# Patient Record
Sex: Male | Born: 1985 | Race: White | Hispanic: Yes | Marital: Single | Smoking: Never smoker
Health system: Southern US, Community
[De-identification: ages and names within clinical notes are randomized; demographics above are authoritative.]

---
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
## 2007-02-01 IMAGING — CR DG FOREARM 2V*L*
1 series · 2 of 2 positions shown · non-contrast
Comparison: none

REASON FOR EXAM: pain
COMMENTS:

PROCEDURE:     DXR - DXR FOREARM LEFT  - October 30, 2005  [DATE]
RESULT:     Two views of the LEFT forearm show no fracture, dislocation or
other acute bony abnormality.

[Series 1: view not recorded · 0.17mm/px · 2 of 2 slices shown]
[im 1/2]
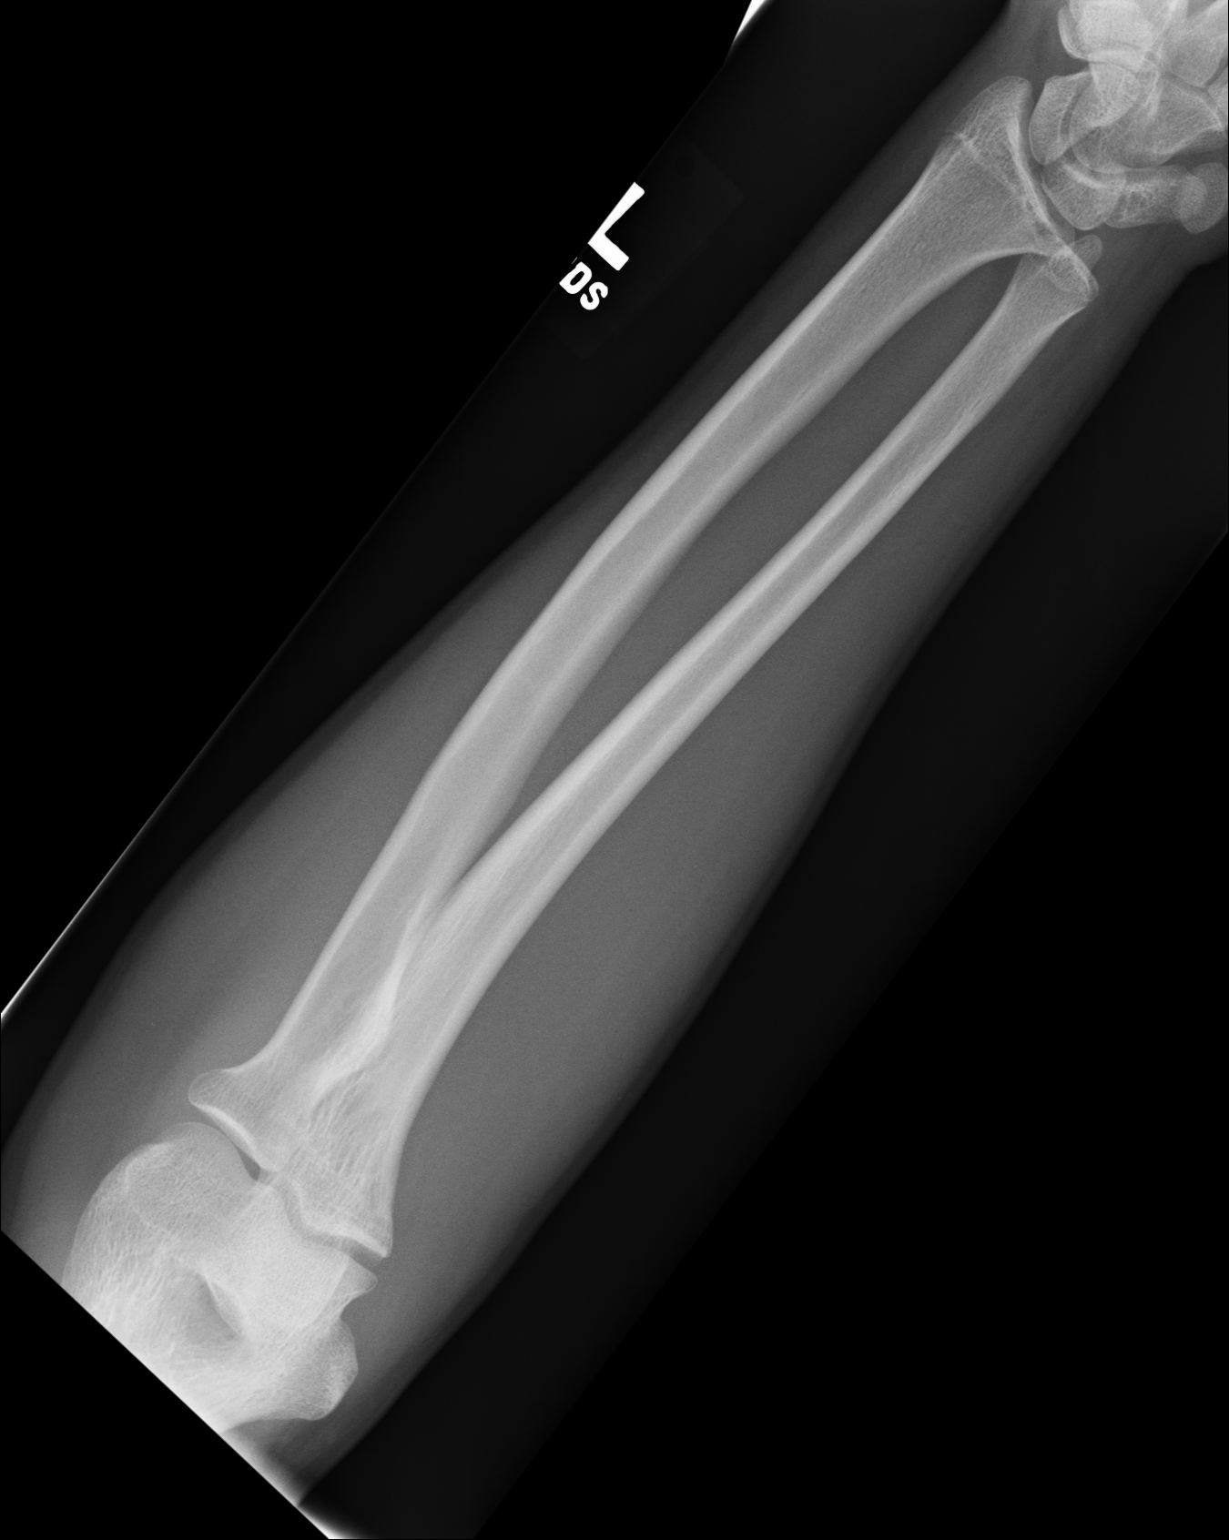
[im 2/2]
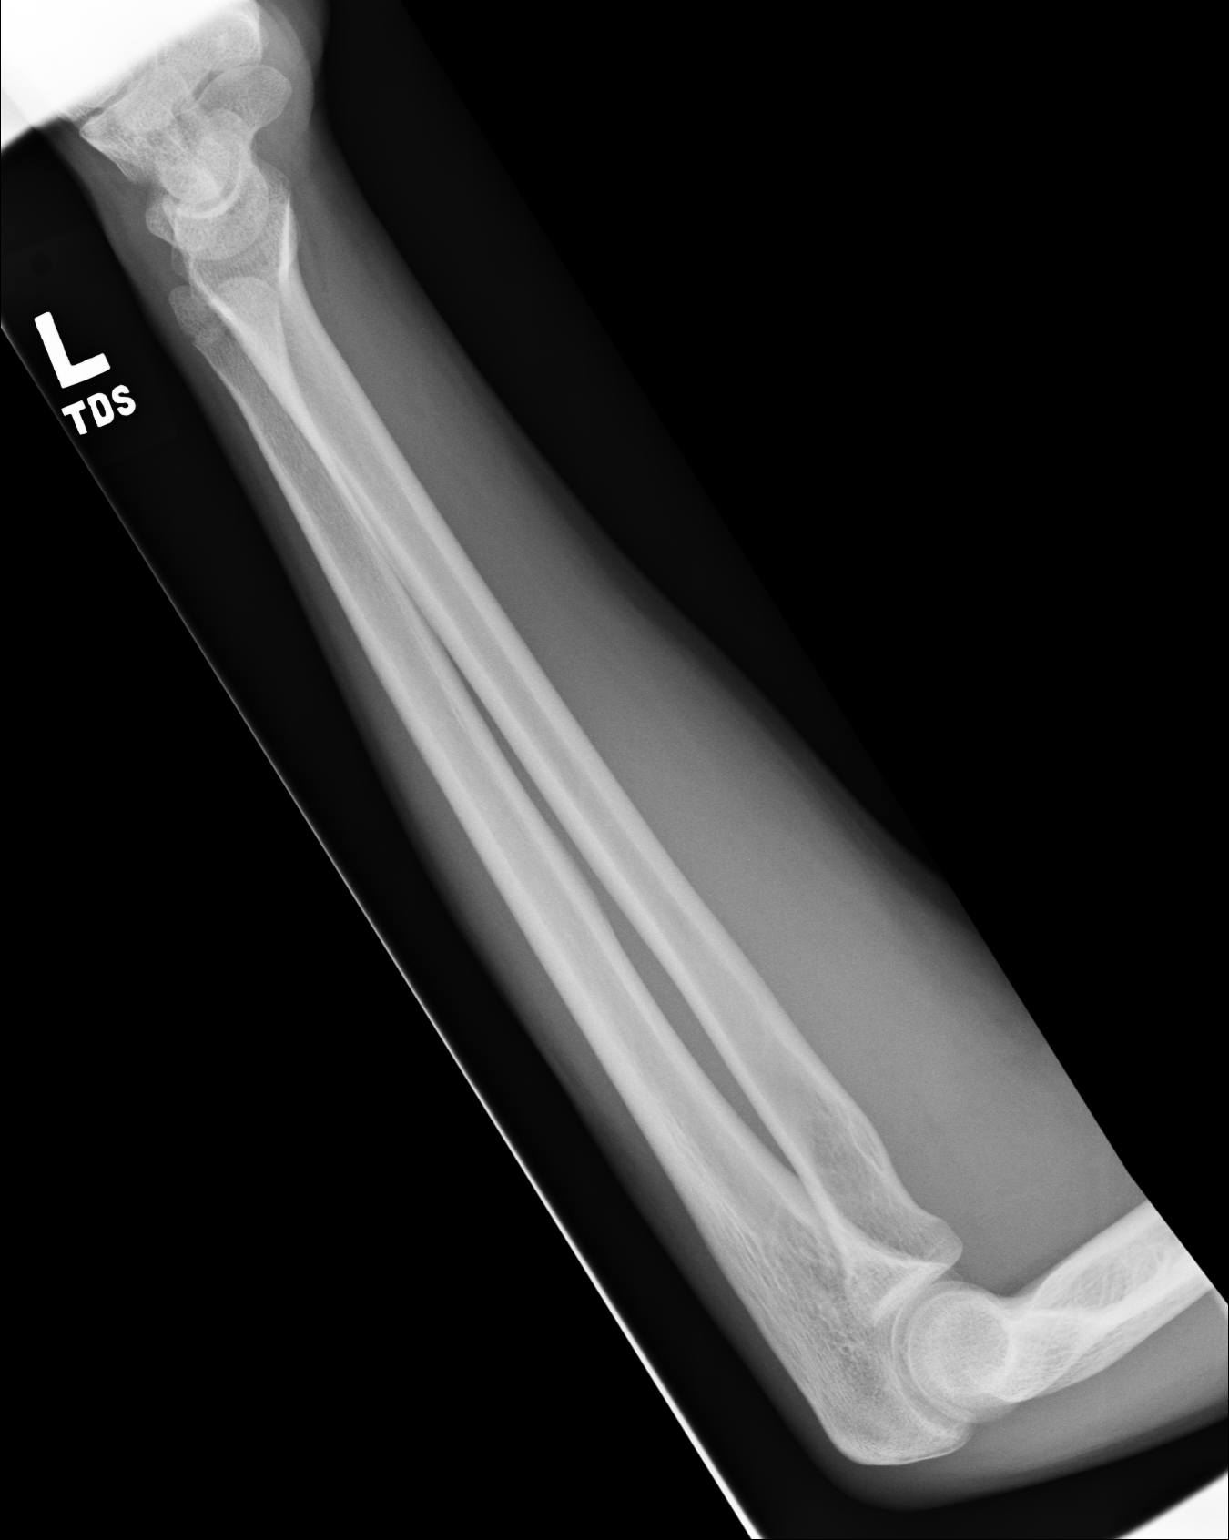

[2 of 2 positions shown; findings below may reference images not displayed]

IMPRESSION: 1)No acute changes are identified.

## 2015-12-27 ENCOUNTER — Encounter: Payer: Self-pay | Admitting: Podiatry

## 2015-12-27 ENCOUNTER — Ambulatory Visit (INDEPENDENT_AMBULATORY_CARE_PROVIDER_SITE_OTHER): Payer: 59

## 2015-12-27 ENCOUNTER — Ambulatory Visit (INDEPENDENT_AMBULATORY_CARE_PROVIDER_SITE_OTHER): Payer: 59 | Admitting: Podiatry

## 2015-12-27 VITALS — BP 125/81 | HR 74 | Resp 18

## 2015-12-27 DIAGNOSIS — B351 Tinea unguium: Secondary | ICD-10-CM | POA: Diagnosis not present

## 2015-12-27 DIAGNOSIS — R52 Pain, unspecified: Secondary | ICD-10-CM | POA: Diagnosis not present

## 2015-12-27 DIAGNOSIS — M722 Plantar fascial fibromatosis: Secondary | ICD-10-CM

## 2015-12-27 DIAGNOSIS — B353 Tinea pedis: Secondary | ICD-10-CM | POA: Insufficient documentation

## 2015-12-27 MED ORDER — MELOXICAM 15 MG PO TABS: 15.0000 mg | ORAL_TABLET | Freq: Every day | ORAL | Status: DC

## 2015-12-27 MED ORDER — TERBINAFINE HCL 250 MG PO TABS: 250.0000 mg | ORAL_TABLET | Freq: Every day | ORAL | Status: DC

## 2015-12-27 NOTE — Patient Instructions (Addendum)
Terbinafine oral granules What is this medicine? TERBINAFINE (TER bin a feen) is an antifungal medicine. It is used to treat certain kinds of fungal or yeast infections. This medicine may be used for other purposes; ask your health care provider or pharmacist if you have questions. What should I tell my health care provider before I take this medicine? They need to know if you have any of these conditions: -drink alcoholic beverages -kidney disease -liver disease -an unusual or allergic reaction to Terbinafine, other medicines, foods, dyes, or preservatives -pregnant or trying to get pregnant -breast-feeding How should I use this medicine? Take this medicine by mouth. Follow the directions on the prescription label. Hold packet with cut line on top. Shake packet gently to settle contents. Tear packet open along cut line, or use scissors to cut across line. Carefully pour the entire contents of packet onto a spoonful of a soft food, such as pudding or other soft, non-acidic food such as mashed potatoes (do NOT use applesauce or a fruit-based food). If two packets are required for each dose, you may either sprinkle the content of both packets on one spoonful of non-acidic food, or sprinkle the contents of both packets on two spoonfuls of non-acidic food. Make sure that no granules remain in the packet. Swallow the mxiture of the food and granules without chewing. Take your medicine at regular intervals. Do not take it more often than directed. Take all of your medicine as directed even if you think you are better. Do not skip doses or stop your medicine early. Contact your pediatrician or health care professional regarding the use of this medicine in children. While this medicine may be prescribed for children as young as 4 years for selected conditions, precautions do apply. Overdosage: If you think you have taken too much of this medicine contact a poison control center or emergency room at  once. NOTE: This medicine is only for you. Do not share this medicine with others. What if I miss a dose? If you miss a dose, take it as soon as you can. If it is almost time for your next dose, take only that dose. Do not take double or extra doses. What may interact with this medicine? Do not take this medicine with any of the following medications: -thioridazine This medicine may also interact with the following medications: -beta-blockers -caffeine -cimetidine -cyclosporine -MAOIs like Carbex, Eldepryl, Marplan, Nardil, and Parnate -medicines for fungal infections like fluconazole and ketoconazole -medicines for irregular heartbeat like amiodarone, flecainide and propafenone -rifampin -SSRIs like citalopram, escitalopram, fluoxetine, fluvoxamine, paroxetine and sertraline -tricyclic antidepressants like amitriptyline, clomipramine, desipramine, imipramine, nortriptyline, and others -warfarin This list may not describe all possible interactions. Give your health care provider a list of all the medicines, herbs, non-prescription drugs, or dietary supplements you use. Also tell them if you smoke, drink alcohol, or use illegal drugs. Some items may interact with your medicine. What should I watch for while using this medicine? Your doctor may monitor your liver function. Tell your doctor right away if you have nausea or vomiting, loss of appetite, stomach pain on your right upper side, yellow skin, dark urine, light stools, or are over tired. You need to take this medicine for 6 weeks or longer to cure the fungal infection. Take your medicine regularly for as long as your doctor or health care professional tells you to. What side effects may I notice from receiving this medicine? Side effects that you should report to your doctor or   health care professional as soon as possible: -allergic reactions like skin rash or hives, swelling of the face, lips, or tongue -change in vision -dark  urine -fever or infection -general ill feeling or flu-like symptoms -light-colored stools -loss of appetite, nausea -redness, blistering, peeling or loosening of the skin, including inside the mouth -right upper belly pain -unusually weak or tired -yellowing of the eyes or skin Side effects that usually do not require medical attention (report to your doctor or health care professional if they continue or are bothersome): -changes in taste -diarrhea -hair loss -muscle or joint pain -stomach upset This list may not describe all possible side effects. Call your doctor for medical advice about side effects. You may report side effects to FDA at 1-800-FDA-1088. Where should I keep my medicine? Keep out of the reach of children. Store at room temperature between 15 and 30 degrees C (59 and 86 degrees F). Throw away any unused medicine after the expiration date. NOTE: This sheet is a summary. It may not cover all possible information. If you have questions about this medicine, talk to your doctor, pharmacist, or health care provider.    2016, Elsevier/Gold Standard. (2007-11-19 17:25:48)   Plantar Fasciitis With Rehab The plantar fascia is a fibrous, ligament-like, soft-tissue structure that spans the bottom of the foot. Plantar fasciitis, also called heel spur syndrome, is a condition that causes pain in the foot due to inflammation of the tissue. SYMPTOMS   Pain and tenderness on the underneath side of the foot.  Pain that worsens with standing or walking. CAUSES  Plantar fasciitis is caused by irritation and injury to the plantar fascia on the underneath side of the foot. Common mechanisms of injury include:  Direct trauma to bottom of the foot.  Damage to a small nerve that runs under the foot where the main fascia attaches to the heel bone.  Stress placed on the plantar fascia due to bone spurs. RISK INCREASES WITH:   Activities that place stress on the plantar fascia  (running, jumping, pivoting, or cutting).  Poor strength and flexibility.  Improperly fitted shoes.  Tight calf muscles.  Flat feet.  Failure to warm-up properly before activity.  Obesity. PREVENTION  Warm up and stretch properly before activity.  Allow for adequate recovery between workouts.  Maintain physical fitness:  Strength, flexibility, and endurance.  Cardiovascular fitness.  Maintain a health body weight.  Avoid stress on the plantar fascia.  Wear properly fitted shoes, including arch supports for individuals who have flat feet. PROGNOSIS  If treated properly, then the symptoms of plantar fasciitis usually resolve without surgery. However, occasionally surgery is necessary. RELATED COMPLICATIONS   Recurrent symptoms that may result in a chronic condition.  Problems of the lower back that are caused by compensating for the injury, such as limping.  Pain or weakness of the foot during push-off following surgery.  Chronic inflammation, scarring, and partial or complete fascia tear, occurring more often from repeated injections. TREATMENT  Treatment initially involves the use of ice and medication to help reduce pain and inflammation. The use of strengthening and stretching exercises may help reduce pain with activity, especially stretches of the Achilles tendon. These exercises may be performed at home or with a therapist. Your caregiver may recommend that you use heel cups of arch supports to help reduce stress on the plantar fascia. Occasionally, corticosteroid injections are given to reduce inflammation. If symptoms persist for greater than 6 months despite non-surgical (conservative), then surgery may be  recommended.  MEDICATION   If pain medication is necessary, then nonsteroidal anti-inflammatory medications, such as aspirin and ibuprofen, or other minor pain relievers, such as acetaminophen, are often recommended.  Do not take pain medication within 7 days  before surgery.  Prescription pain relievers may be given if deemed necessary by your caregiver. Use only as directed and only as much as you need.  Corticosteroid injections may be given by your caregiver. These injections should be reserved for the most serious cases, because they may only be given a certain number of times. HEAT AND COLD  Cold treatment (icing) relieves pain and reduces inflammation. Cold treatment should be applied for 10 to 15 minutes every 2 to 3 hours for inflammation and pain and immediately after any activity that aggravates your symptoms. Use ice packs or massage the area with a piece of ice (ice massage).  Heat treatment may be used prior to performing the stretching and strengthening activities prescribed by your caregiver, physical therapist, or athletic trainer. Use a heat pack or soak the injury in warm water. SEEK IMMEDIATE MEDICAL CARE IF:  Treatment seems to offer no benefit, or the condition worsens.  Any medications produce adverse side effects. EXERCISES RANGE OF MOTION (ROM) AND STRETCHING EXERCISES - Plantar Fasciitis (Heel Spur Syndrome) These exercises may help you when beginning to rehabilitate your injury. Your symptoms may resolve with or without further involvement from your physician, physical therapist or athletic trainer. While completing these exercises, remember:   Restoring tissue flexibility helps normal motion to return to the joints. This allows healthier, less painful movement and activity.  An effective stretch should be held for at least 30 seconds.  A stretch should never be painful. You should only feel a gentle lengthening or release in the stretched tissue. RANGE OF MOTION - Toe Extension, Flexion  Sit with your right / left leg crossed over your opposite knee.  Grasp your toes and gently pull them back toward the top of your foot. You should feel a stretch on the bottom of your toes and/or foot.  Hold this stretch for  __________ seconds.  Now, gently pull your toes toward the bottom of your foot. You should feel a stretch on the top of your toes and or foot.  Hold this stretch for __________ seconds. Repeat __________ times. Complete this stretch __________ times per day.  RANGE OF MOTION - Ankle Dorsiflexion, Active Assisted  Remove shoes and sit on a chair that is preferably not on a carpeted surface.  Place right / left foot under knee. Extend your opposite leg for support.  Keeping your heel down, slide your right / left foot back toward the chair until you feel a stretch at your ankle or calf. If you do not feel a stretch, slide your bottom forward to the edge of the chair, while still keeping your heel down.  Hold this stretch for __________ seconds. Repeat __________ times. Complete this stretch __________ times per day.  STRETCH - Gastroc, Standing  Place hands on wall.  Extend right / left leg, keeping the front knee somewhat bent.  Slightly point your toes inward on your back foot.  Keeping your right / left heel on the floor and your knee straight, shift your weight toward the wall, not allowing your back to arch.  You should feel a gentle stretch in the right / left calf. Hold this position for __________ seconds. Repeat __________ times. Complete this stretch __________ times per day. STRETCH - Soleus,  Standing  Place hands on wall.  Extend right / left leg, keeping the other knee somewhat bent.  Slightly point your toes inward on your back foot.  Keep your right / left heel on the floor, bend your back knee, and slightly shift your weight over the back leg so that you feel a gentle stretch deep in your back calf.  Hold this position for __________ seconds. Repeat __________ times. Complete this stretch __________ times per day. STRETCH - Gastrocsoleus, Standing  Note: This exercise can place a lot of stress on your foot and ankle. Please complete this exercise only if  specifically instructed by your caregiver.   Place the ball of your right / left foot on a step, keeping your other foot firmly on the same step.  Hold on to the wall or a rail for balance.  Slowly lift your other foot, allowing your body weight to press your heel down over the edge of the step.  You should feel a stretch in your right / left calf.  Hold this position for __________ seconds.  Repeat this exercise with a slight bend in your right / left knee. Repeat __________ times. Complete this stretch __________ times per day.  STRENGTHENING EXERCISES - Plantar Fasciitis (Heel Spur Syndrome)  These exercises may help you when beginning to rehabilitate your injury. They may resolve your symptoms with or without further involvement from your physician, physical therapist or athletic trainer. While completing these exercises, remember:   Muscles can gain both the endurance and the strength needed for everyday activities through controlled exercises.  Complete these exercises as instructed by your physician, physical therapist or athletic trainer. Progress the resistance and repetitions only as guided. STRENGTH - Towel Curls  Sit in a chair positioned on a non-carpeted surface.  Place your foot on a towel, keeping your heel on the floor.  Pull the towel toward your heel by only curling your toes. Keep your heel on the floor.  If instructed by your physician, physical therapist or athletic trainer, add ____________________ at the end of the towel. Repeat __________ times. Complete this exercise __________ times per day. STRENGTH - Ankle Inversion  Secure one end of a rubber exercise band/tubing to a fixed object (table, pole). Loop the other end around your foot just before your toes.  Place your fists between your knees. This will focus your strengthening at your ankle.  Slowly, pull your big toe up and in, making sure the band/tubing is positioned to resist the entire  motion.  Hold this position for __________ seconds.  Have your muscles resist the band/tubing as it slowly pulls your foot back to the starting position. Repeat __________ times. Complete this exercises __________ times per day.    This information is not intended to replace advice given to you by your health care provider. Make sure you discuss any questions you have with your health care provider.   Document Released: 09/08/2005 Document Revised: 01/23/2015 Document Reviewed: 12/21/2008 Elsevier Interactive Patient Education 2016 Elsevier Inc.  Athlete's Foot Athlete's foot (tinea pedis) is a fungal infection of the skin on the feet. It often occurs on the skin between the toes or underneath the toes. It can also occur on the soles of the feet. Athlete's foot is more likely to occur in hot, humid weather. Not washing your feet or changing your socks often enough can contribute to athlete's foot. The infection can spread from person to person (contagious). CAUSES Athlete's foot is caused by a  fungus. This fungus thrives in warm, moist places. Most people get athlete's foot by sharing shower stalls, towels, and wet floors with an infected person. People with weakened immune systems, including those with diabetes, may be more likely to get athlete's foot. SYMPTOMS   Itchy areas between the toes or on the soles of the feet.  White, flaky, or scaly areas between the toes or on the soles of the feet.  Tiny, intensely itchy blisters between the toes or on the soles of the feet.  Tiny cuts on the skin. These cuts can develop a bacterial infection.  Thick or discolored toenails. DIAGNOSIS  Your caregiver can usually tell what the problem is by doing a physical exam. Your caregiver may also take a skin sample from the rash area. The skin sample may be examined under a microscope, or it may be tested to see if fungus will grow in the sample. A sample may also be taken from your toenail for  testing. TREATMENT  Over-the-counter and prescription medicines can be used to kill the fungus. These medicines are available as powders or creams. Your caregiver can suggest medicines for you. Fungal infections respond slowly to treatment. You may need to continue using your medicine for several weeks. PREVENTION   Do not share towels.  Wear sandals in wet areas, such as shared locker rooms and shared showers.  Keep your feet dry. Wear shoes that allow air to circulate. Wear cotton or wool socks. HOME CARE INSTRUCTIONS   Take medicines as directed by your caregiver. Do not use steroid creams on athlete's foot.  Keep your feet clean and cool. Wash your feet daily and dry them thoroughly, especially between your toes.  Change your socks every day. Wear cotton or wool socks. In hot climates, you may need to change your socks 2 to 3 times per day.  Wear sandals or canvas tennis shoes with good air circulation.  If you have blisters, soak your feet in Burow's solution or Epsom salts for 20 to 30 minutes, 2 times a day to dry out the blisters. Make sure you dry your feet thoroughly afterward. SEEK MEDICAL CARE IF:   You have a fever.  You have swelling, soreness, warmth, or redness in your foot.  You are not getting better after 7 days of treatment.  You are not completely cured after 30 days.  You have any problems caused by your medicines. MAKE SURE YOU:   Understand these instructions.  Will watch your condition.  Will get help right away if you are not doing well or get worse.   This information is not intended to replace advice given to you by your health care provider. Make sure you discuss any questions you have with your health care provider.   Document Released: 09/05/2000 Document Revised: 12/01/2011 Document Reviewed: 03/12/2015 Elsevier Interactive Patient Education [employer]2016 Else[employer] Inc.

## 2015-12-27 NOTE — Progress Notes (Signed)
   Subjective:    Patient ID: Colin Davis, male    DOB: 1986-01-26, 30 y.o.   MRN: [id number]  HPI  30 year old male presents the office today for concerns of bilateral heel pain which has been ongoing for pocket 6 months. He states he has purchased over-the-counter inserts which seems to help. He does state he continues to get pain to the bottom of the heels. Denies any surrounding redness or drainage. No swelling or redness. The majority the pain is in of the day after his been standing all day.  He also states that he has athlete's foot as well as toenail fungus which is been ongoing. Said no previous treatment. Denies any pain to the toenails. Denies any redness or drainage. No other complaints.   Review of Systems  All other systems reviewed and are negative.      Objective:   Physical Exam General: AAO x3, NAD  Dermatological: Nails are very hypertrophic, dystrophic, brittle, discolored. There is no swelling erythema or drainage. No tenderness the nails. There is dry, xerotic, erythematous skin on the plantar aspect of the foot as well as interdigitally consistent with significant tinea pedis. There is no open sores identified this time there is no drainage.  Vascular: Dorsalis Pedis artery and Posterior Tibial artery pedal pulses are 2/4 bilateral with immedate capillary fill time. Pedal hair growth present. No varicosities and no lower extremity edema present bilateral. There is no pain with calf compression, swelling, warmth, erythema.   Neruologic: Grossly intact via light touch bilateral. Vibratory intact via tuning fork bilateral. Protective threshold with Semmes Wienstein monofilament intact to all pedal sites bilateral. Patellar and Achilles deep tendon reflexes 2+ bilateral. No Babinski or clonus noted bilateral.   Musculoskeletal: Tenderness to palpation along the plantar medial tubercle of the calcaneus at the insertion of plantar fascia on the left and  right foot. There is no pain along the course of the plantar fascia within the arch of the foot. Plantar fascia appears to be intact. There is no pain with lateral compression of the calcaneus or pain with vibratory sensation. There is no pain along the course or insertion of the achilles tendon. No other areas of tenderness to bilateral lower extremities. MMT 5/5, ROM WNL.    Gait: Unassisted, Nonantalgic.      Assessment & Plan:  30 year old male bilateral heel pain, onychodystrophy likely onychomycosis/tinea pedis -Treatment options discussed including all alternatives, risks, and complications -X-rays were obtained and reviewed with the patient. No evidence of acute fracture or stress fracture. -Etiology of symptoms were discussed -Discussed steroid injection however given the significant tinea over on the side of were would inject we'll hold off on that for now. Prescribed meloxicam and discussed that affects. Stretching, icing exercises daily. Continue inserts for now. -In regards the tinea pedis will go insert 14 day course of Lamisil. The nails were biopsied and sent for cultures/pathology. Will likely continue Lamisil however will await the results of the biopsy. Discussed shoe hygiene. -Follow-up in 3 weeks or sooner if any problems arise. In the meantime, encouraged to call the office with any questions, concerns, change in symptoms.   Ovid CurdMatthew Wagoner, DPM

## 2015-12-28 NOTE — Addendum Note (Signed)
Addended by: Hadley PenOX, Baylin Cabal R on: 12/28/2015 11:41 AM   Modules accepted: Orders

## 2016-01-22 ENCOUNTER — Ambulatory Visit (INDEPENDENT_AMBULATORY_CARE_PROVIDER_SITE_OTHER): Payer: 59 | Admitting: Podiatry

## 2016-01-22 ENCOUNTER — Encounter: Payer: Self-pay | Admitting: Podiatry

## 2016-01-22 DIAGNOSIS — B353 Tinea pedis: Secondary | ICD-10-CM | POA: Diagnosis not present

## 2016-01-22 DIAGNOSIS — M722 Plantar fascial fibromatosis: Secondary | ICD-10-CM

## 2016-01-22 DIAGNOSIS — M79673 Pain in unspecified foot: Secondary | ICD-10-CM

## 2016-01-22 DIAGNOSIS — B351 Tinea unguium: Secondary | ICD-10-CM | POA: Diagnosis not present

## 2016-01-22 MED ORDER — TERBINAFINE HCL 250 MG PO TABS: 250.0000 mg | ORAL_TABLET | Freq: Every day | ORAL | Status: DC

## 2016-01-22 MED ORDER — MELOXICAM 15 MG PO TABS: 15.0000 mg | ORAL_TABLET | Freq: Every day | ORAL | Status: DC

## 2016-01-22 NOTE — Patient Instructions (Signed)

## 2016-01-25 LAB — CBC WITH DIFFERENTIAL/PLATELET
Basophils Absolute: 0 10*3/uL (ref 0.0–0.2)
Basos: 0 %
EOS (ABSOLUTE): 0 10*3/uL (ref 0.0–0.4)
EOS: 1 %
HEMATOCRIT: 43.9 % (ref 37.5–51.0)
HEMOGLOBIN: 14.7 g/dL (ref 12.6–17.7)
IMMATURE GRANULOCYTES: 0 %
Immature Grans (Abs): 0 10*3/uL (ref 0.0–0.1)
Lymphocytes Absolute: 2.4 10*3/uL (ref 0.7–3.1)
Lymphs: 34 %
MCH: 30.9 pg (ref 26.6–33.0)
MCHC: 33.5 g/dL (ref 31.5–35.7)
MCV: 92 fL (ref 79–97)
MONOCYTES: 4 %
Monocytes Absolute: 0.3 10*3/uL (ref 0.1–0.9)
NEUTROS PCT: 61 %
Neutrophils Absolute: 4.3 10*3/uL (ref 1.4–7.0)
Platelets: 225 10*3/uL (ref 150–379)
RBC: 4.75 x10E6/uL (ref 4.14–5.80)
RDW: 13 % (ref 12.3–15.4)
WBC: 7 10*3/uL (ref 3.4–10.8)

## 2016-01-25 LAB — HEPATIC FUNCTION PANEL
ALT: 23 IU/L (ref 0–44)
AST: 16 IU/L (ref 0–40)
Albumin: 4.9 g/dL (ref 3.5–5.5)
Alkaline Phosphatase: 70 IU/L (ref 39–117)
Bilirubin Total: 1.2 mg/dL (ref 0.0–1.2)
Bilirubin, Direct: 0.25 mg/dL (ref 0.00–0.40)
TOTAL PROTEIN: 7.4 g/dL (ref 6.0–8.5)

## 2016-01-26 DIAGNOSIS — B351 Tinea unguium: Secondary | ICD-10-CM | POA: Insufficient documentation

## 2016-01-26 NOTE — Progress Notes (Signed)
Patient ID: Colin SkeansJose Rogelio Hernandez-Villela, male   DOB: 08/06/1986, 30 y.o.   MRN: [id number]  Subjective: 30 year old male presents the also discussed nail culture results as well as for follow-up evaluation of bilateral heel pain. This for the toenails are concerned he's had no change. He has been on 14 days of Lamisil he states that the skin has improved however. As far as the heels are concerned he is continued to have pain of the plantar aspect of the heel and when he first gets up after standing all day. This has been ongoing for 6 months. He has been taking meloxicam which helps he is asking for refill. His been stretching icing intermittently. No numbness or tingling. The pain does not wake him up at night..  Denies any systemic complaints such as fevers, chills, nausea, vomiting. No acute changes since last appointment, and no other complaints at this time.   Objective: AAO x3, NAD DP/PT pulses palpable bilaterally, CRT less than 3 seconds Nails continue to be hypertrophic, dystrophic, brittle, discolored without any tenderness. There is no surrounding redness or drainage. The dry, xerotic, erythematous skin on the plantar the foot and interdigitally has improved however does remain somewhat.  There is continuation tenderness of plantar aspect of the medial tubercle of the calcaneus on the insertion upon her fascia bilaterally. Is no pain along the course of plantar fascial. There is no pain with meals lateral compression. No other areas of tenderness. Negative Tinel sign.  No areas of pinpoint bony tenderness or pain with vibratory sensation. MMT 5/5, ROM WNL. No edema, erythema, increase in warmth to bilateral lower extremities.  No open lesions or pre-ulcerative lesions.  No pain with calf compression, swelling, warmth, erythema  Assessment: Bilateral plantar fasciitis, onychomycosis/tinea pedis   Plan: -All treatment options discussed with the patient including all alternatives,  risks, complications.  -Patient elects to proceed with steroid injection into the left and right heel. Under sterile skin preparation, a total of 2.5cc of kenalog 10, 0.5% Marcaine plain, and 2% lidocaine plain were infiltrated into the symptomatic area without complication. A band-aid was applied. Patient tolerated the injection well without complication. Post-injection care with discussed with the patient. Discussed with the patient to ice the area over the next couple of days to help prevent a steroid flare. Refilled meloxicam. Stretching and icing. Plantar fascial braces. Discussed supportive shoe gear and orthotics.  -Nail culture results were discussed the patient which did reveal onychomycosis. After discussion about treatment options he elected to proceed with Lamisil. We'll continue Lamisil for 3 months. Ordered blood work today. Do not restart the medicines I called with results of the blood work and he understood this. As discussed side effects the medication for which she understands is to call the office in medial should any occur.  -Follow-up 4 weeks.  -Patient encouraged to call the office with any questions, concerns, change in symptoms.   Ovid CurdMatthew Oley Lahaie, DPM

## 2016-01-28 ENCOUNTER — Telehealth: Payer: Self-pay | Admitting: *Deleted

## 2016-01-28 NOTE — Telephone Encounter (Addendum)
-----   Message from Vivi BarrackMatthew R Wagoner, DPM sent at 01/26/2016  8:42 AM EDT ----- Can you please let him know he can start lamisil. F/U 4 weeks.  01/28/2016-Left message for pt to call for results.

## 2016-02-19 ENCOUNTER — Ambulatory Visit (INDEPENDENT_AMBULATORY_CARE_PROVIDER_SITE_OTHER): Payer: 59 | Admitting: Podiatry

## 2016-02-19 VITALS — BP 126/74 | HR 75 | Resp 18

## 2016-02-19 DIAGNOSIS — Z79899 Other long term (current) drug therapy: Secondary | ICD-10-CM

## 2016-02-19 DIAGNOSIS — M722 Plantar fascial fibromatosis: Secondary | ICD-10-CM

## 2016-02-19 DIAGNOSIS — B351 Tinea unguium: Secondary | ICD-10-CM

## 2016-02-19 MED ORDER — TERBINAFINE HCL 250 MG PO TABS: 250.0000 mg | ORAL_TABLET | Freq: Every day | ORAL | Status: DC

## 2016-02-19 NOTE — Progress Notes (Signed)
Patient ID: Colin SkeansJose Rogelio Hernandez-Villela, male   DOB: 1986/07/03, 30 y.o.   MRN: [id number]  Subjective: 30 year old male presents the office today 4 weeks ago started Lamisil. He states that his toenails have cleared up somewhat he states the skin is looking much better. He also states he is not getting any pain to his heels this time but he does get some intermittent discomfort but is significantly improved to what it was. He has continue the stretching, icing. Denies any systemic complaints such as fevers, chills, nausea, vomiting. No acute changes since last appointment, and no other complaints at this time.   Objective: AAO x3, NAD DP/PT pulses palpable bilaterally, CRT less than 3 seconds This time there is no tenderness to palpation on the plantar medial tubercle of the calcaneus at the insertion of the plantar fascia. There is no pain on the course of plantar fascial in the arch of the foot. No area pinpoint tenderness. There does appear to be clearing the proximal nail borders. There is no tenderness the nails. No surrounding redness or drainage. There is no significant tinea pedis or skin lesions at this time. No areas of pinpoint bony tenderness or pain with vibratory sensation. MMT 5/5, ROM WNL. No edema, erythema, increase in warmth to bilateral lower extremities.  No open lesions or pre-ulcerative lesions.  No pain with calf compression, swelling, warmth, erythema  Assessment: Results heel pain, without symptoms, resolving onychomycosis  Plan: -All treatment options discussed with the patient including all alternatives, risks, complications.  -For the heel pain continue stretching, icing, supportive shoe and inserts. He isn't wearing the inserts regular which seem to help quite a bit. -Refilled Lamisil for 60 days. He said no side effects he reports. Also ordered CBC and LFT. -F/U 2 months or sooner if any issues arise.  -Patient encouraged to call the office with any questions,  concerns, change in symptoms.   Ovid CurdMatthew Sharleen Szczesny, DPM

## 2016-02-26 ENCOUNTER — Telehealth: Payer: Self-pay | Admitting: Podiatry

## 2016-02-26 LAB — CBC WITH DIFFERENTIAL/PLATELET
BASOS: 0 %
Basophils Absolute: 0 10*3/uL (ref 0.0–0.2)
EOS (ABSOLUTE): 0.1 10*3/uL (ref 0.0–0.4)
EOS: 1 %
Hematocrit: 44 % (ref 37.5–51.0)
Hemoglobin: 15.3 g/dL (ref 12.6–17.7)
IMMATURE GRANS (ABS): 0 10*3/uL (ref 0.0–0.1)
IMMATURE GRANULOCYTES: 0 %
LYMPHS: 41 %
Lymphocytes Absolute: 2.1 10*3/uL (ref 0.7–3.1)
MCH: 31.7 pg (ref 26.6–33.0)
MCHC: 34.8 g/dL (ref 31.5–35.7)
MCV: 91 fL (ref 79–97)
Monocytes Absolute: 0.3 10*3/uL (ref 0.1–0.9)
Monocytes: 6 %
NEUTROS PCT: 52 %
Neutrophils Absolute: 2.7 10*3/uL (ref 1.4–7.0)
PLATELETS: 218 10*3/uL (ref 150–379)
RBC: 4.83 x10E6/uL (ref 4.14–5.80)
RDW: 13 % (ref 12.3–15.4)
WBC: 5.2 10*3/uL (ref 3.4–10.8)

## 2016-02-26 LAB — HEPATIC FUNCTION PANEL
ALBUMIN: 4.9 g/dL (ref 3.5–5.5)
ALT: 23 IU/L (ref 0–44)
AST: 18 IU/L (ref 0–40)
Alkaline Phosphatase: 76 IU/L (ref 39–117)
BILIRUBIN TOTAL: 0.8 mg/dL (ref 0.0–1.2)
BILIRUBIN, DIRECT: 0.18 mg/dL (ref 0.00–0.40)
TOTAL PROTEIN: 7.5 g/dL (ref 6.0–8.5)

## 2016-02-26 NOTE — Telephone Encounter (Signed)
Pt called in wanting to set up payment arrangements

## 2016-02-27 ENCOUNTER — Telehealth: Payer: Self-pay | Admitting: *Deleted

## 2016-02-27 NOTE — Telephone Encounter (Addendum)
-----   Message from Vivi BarrackMatthew R Wagoner, DPM sent at 02/26/2016  5:35 PM EDT ----- OK to continue lamisil. Please let him know. Thanks. 02/27/2016-Left message informing pt of Dr. Gabriel RungWagoner's orders.

## 2016-03-04 ENCOUNTER — Other Ambulatory Visit: Payer: Self-pay | Admitting: Podiatry

## 2016-04-10 ENCOUNTER — Ambulatory Visit (INDEPENDENT_AMBULATORY_CARE_PROVIDER_SITE_OTHER): Payer: 59 | Admitting: Podiatry

## 2016-04-10 ENCOUNTER — Encounter: Payer: Self-pay | Admitting: Podiatry

## 2016-04-10 DIAGNOSIS — M722 Plantar fascial fibromatosis: Secondary | ICD-10-CM

## 2016-04-10 DIAGNOSIS — B351 Tinea unguium: Secondary | ICD-10-CM

## 2016-04-11 NOTE — Progress Notes (Signed)
Patient ID: Colin Davis, male   DOB: 1986/01/29, 30 y.o.   MRN: [id number]  Subjective: 30 year old male presents the also a popliteal vibration onychomycosis. He is completed 3 months of Lamisil he feels that this toenails the skin is significantly improved. Denies any pain to the toenails. He also states that his left heel is starting to hurt some more. States his his meds but was originally but is somewhat uncomfortable in the mornings after standing all day. Denies he swelling or redness. No numbness or tingling. Denies any systemic complaints such as fevers, chills, nausea, vomiting. No acute changes since last appointment, and no other complaints at this time.   Objective: AAO x3, NAD DP/PT pulses palpable bilaterally, CRT less than 3 seconds Nails very somewhat dystrophic and discolored distally have there is clearing on the proximal nail borders. The nails appear to be significantly improved compared with a were present. There is no peeling the skin and there is no signs of tinea pedis. There is mild tenderness to palpation along the plantar medial tubercle of the calcaneus at the insertion of plantar fascia on the left foot. There is no pain along the course of the plantar fascia within the arch of the foot. Plantar fascia appears to be intact. There is no pain with lateral compression of the calcaneus or pain with vibratory sensation. There is no pain along the course or insertion of the achilles tendon. No other areas of tenderness to bilateral lower extremities. No open lesions or pre-ulcerative lesions.  No pain with calf compression, swelling, warmth, erythema  Assessment: Resolving onychomycosis; left plantar fasciitis   Plan: -All treatment options discussed with the patient including all alternatives, risks, complications.  -Discussed the topical treatment to the toenail fungus to help continuing the nails grow out.  -Patient elects to proceed with steroid  injection into the left heel. Under sterile skin preparation, a total of 2.5cc of kenalog 10, 0.5% Marcaine plain, and 2% lidocaine plain were infiltrated into the symptomatic area without complication. A band-aid was applied. Patient tolerated the injection well without complication. Post-injection care with discussed with the patient. Discussed with the patient to ice the area over the next couple of days to help prevent a steroid flare. Continue stretching, icing exercises daily. Continue supportive shoe gear discussed orthotics. -Follow-up in 4 weeks if any symptoms continue or sooner if any issues are to arise.  Ovid CurdMatthew Kimberleigh Mehan, DPM

## 2016-10-31 ENCOUNTER — Ambulatory Visit (INDEPENDENT_AMBULATORY_CARE_PROVIDER_SITE_OTHER): Payer: 59 | Admitting: Podiatry

## 2016-10-31 DIAGNOSIS — M79672 Pain in left foot: Secondary | ICD-10-CM | POA: Diagnosis not present

## 2016-10-31 DIAGNOSIS — M79673 Pain in unspecified foot: Secondary | ICD-10-CM | POA: Diagnosis not present

## 2016-10-31 DIAGNOSIS — M79671 Pain in right foot: Secondary | ICD-10-CM

## 2016-10-31 DIAGNOSIS — M722 Plantar fascial fibromatosis: Secondary | ICD-10-CM

## 2016-10-31 MED ORDER — MELOXICAM 15 MG PO TABS: 15.0000 mg | ORAL_TABLET | Freq: Every day | ORAL | 1 refills | Status: AC

## 2016-11-15 MED ORDER — BETAMETHASONE SOD PHOS & ACET 6 (3-3) MG/ML IJ SUSP: 3.0000 mg | Freq: Once | INTRAMUSCULAR | Status: AC

## 2016-11-15 NOTE — Progress Notes (Signed)
   Subjective: Patient presents today for pain and tenderness in the feet bilaterally. Patient states the foot pain has been hurting for several weeks now. Patient states that it hurts in the mornings with the first steps out of bed. Patient presents today for further treatment and evaluation  Objective: Physical Exam General: The patient is alert and oriented x3 in no acute distress.  Dermatology: Skin is warm, dry and supple bilateral lower extremities. Negative for open lesions or macerations bilateral.   Vascular: Dorsalis Pedis and Posterior Tibial pulses palpable bilateral.  Capillary fill time is immediate to all digits.  Neurological: Epicritic and protective threshold intact bilateral.   Musculoskeletal: Tenderness to palpation at the medial calcaneal tubercale and through the insertion of the plantar fascia of the bilateral feet. All other joints range of motion within normal limits bilateral. Strength 5/5 in all groups bilateral.   Radiographic exam: Normal osseous mineralization. Joint spaces preserved. No fracture/dislocation/boney destruction. Calcaneal spur present with mild thickening of plantar fascia bilateral. No other soft tissue abnormalities or radiopaque foreign bodies.   Assessment: #1 plantar fasciitis bilateral feet #2 pain in bilateral feet  Plan of Care:  1. Patient evaluated. Xrays reviewed.   2. Injection of 0.5cc Celestone soluspan injected into the bilateral heels.  3. Instructed patient regarding therapies and modalities at home to alleviate symptoms.  4. Rx for Meloxicam 15mg  PO given to patient.  5. Patient purchase power step insoles over-the-counter 6. Return to clinic in 4 weeks  Felecia ShellingBrent M. Jerita Wimbush, DPM [facility]  Dr. Felecia ShellingBrent M. Alvaro Aungst, DPM    [address]                                        [place], [place] [address]                Office [phone]  Fax [phone]

## 2016-11-28 ENCOUNTER — Ambulatory Visit: Payer: 59 | Admitting: Podiatry

## 2017-01-30 ENCOUNTER — Encounter: Payer: Self-pay | Admitting: Podiatry

## 2017-01-30 ENCOUNTER — Ambulatory Visit (INDEPENDENT_AMBULATORY_CARE_PROVIDER_SITE_OTHER): Payer: 59 | Admitting: Podiatry

## 2017-01-30 DIAGNOSIS — M722 Plantar fascial fibromatosis: Secondary | ICD-10-CM

## 2017-01-30 MED ORDER — MELOXICAM 15 MG PO TABS: 15.0000 mg | ORAL_TABLET | Freq: Every day | ORAL | 3 refills | Status: AC

## 2017-01-30 MED ORDER — METHYLPREDNISOLONE 4 MG PO TABS: 4.0000 mg | ORAL_TABLET | Freq: Every day | ORAL | 0 refills | Status: DC

## 2017-02-02 NOTE — Progress Notes (Signed)
   Subjective: Patient presents today for follow up evaluation of bilateral plantar fasciitis. Patient states that her pain is most noticeable when she gets out of bed in the morning. She has not been taking the Meloxicam. She has been wearing Powerstep insoles with some relief. She states the injections did not help alleviate the pain. Patient presents today for further treatment and evaluation  Objective: Physical Exam General: The patient is alert and oriented x3 in no acute distress.  Dermatology: Skin is warm, dry and supple bilateral lower extremities. Negative for open lesions or macerations bilateral.   Vascular: Dorsalis Pedis and Posterior Tibial pulses palpable bilateral.  Capillary fill time is immediate to all digits.  Neurological: Epicritic and protective threshold intact bilateral.   Musculoskeletal: Tenderness to palpation at the medial calcaneal tubercale and through the insertion of the plantar fascia of the bilateral feet. All other joints range of motion within normal limits bilateral. Strength 5/5 in all groups bilateral.    Assessment: #1 plantar fasciitis bilateral feet #2 pain in bilateral feet  Plan of Care:  1. Patient evaluated.    2. Prescription for Medrol Dose Pak given. 3. Prescription for Meloxicam provided. 4. Return to clinic in 3 weeks. If not better, then EPAT.   Felecia ShellingBrent M. Luvina Poirier, DPM [facility]  Dr. Felecia ShellingBrent M. Brysen Shankman, DPM    [address]                                        [place], [place] [address]                Office [phone]  Fax [phone]) [phone]

## 2017-02-20 ENCOUNTER — Ambulatory Visit (INDEPENDENT_AMBULATORY_CARE_PROVIDER_SITE_OTHER): Payer: 59 | Admitting: Podiatry

## 2017-02-20 DIAGNOSIS — M722 Plantar fascial fibromatosis: Secondary | ICD-10-CM

## 2017-02-21 NOTE — Progress Notes (Signed)
   Subjective: Patient presents today for follow up evaluation of bilateral plantar fasciitis. Patient states that his pain has worsened in the left foot. There are no modifying factors noted. He is here for further evaluation and treatment.  Objective: Physical Exam General: The patient is alert and oriented x3 in no acute distress.  Dermatology: Skin is warm, dry and supple bilateral lower extremities. Negative for open lesions or macerations bilateral.   Vascular: Dorsalis Pedis and Posterior Tibial pulses palpable bilateral.  Capillary fill time is immediate to all digits.  Neurological: Epicritic and protective threshold intact bilateral.   Musculoskeletal: Tenderness to palpation at the medial calcaneal tubercale and through the insertion of the plantar fascia of the bilateral feet. All other joints range of motion within normal limits bilateral. Strength 5/5 in all groups bilateral.    Assessment: #1 plantar fasciitis bilateral feet #2 pain in bilateral feet  Plan of Care:  1. Patient evaluated.    2. Appointment with Shanda BumpsJessica, RN for EPAT. 3. Return to clinic in 8 weeks.   Felecia ShellingBrent M. Evans, DPM [facility]  Dr. Felecia ShellingBrent M. Evans, DPM    [address]                                        [place], [place] [address]                Office [phone]  Fax [phone]) [phone]

## 2017-02-23 ENCOUNTER — Ambulatory Visit: Payer: Self-pay | Admitting: Podiatry

## 2017-02-23 DIAGNOSIS — M722 Plantar fascial fibromatosis: Secondary | ICD-10-CM

## 2017-03-02 ENCOUNTER — Ambulatory Visit: Payer: Self-pay | Admitting: Podiatry

## 2017-03-02 DIAGNOSIS — B351 Tinea unguium: Secondary | ICD-10-CM

## 2017-03-02 DIAGNOSIS — M722 Plantar fascial fibromatosis: Secondary | ICD-10-CM

## 2017-03-10 NOTE — Progress Notes (Signed)
Pt presents with ongoing pain in both heels, lasting for several months. He states that he has tried other therapies with no relief in pain. He states that he noticed a slight increase in his pain but then it has returned to baseline  Pain on palpation of medial to mid heel 6 of 10  ESWT administered to bilateral heels for 8 joules and tolerated well. Advised against NSAIDs and ice. He is to follow up in 1 week for 3rd  treatment

## 2017-03-10 NOTE — Progress Notes (Signed)
Pt presents with ongoing pain in both heels, lasting for several months. He states that he has tried other therapies with no relief in pain  Pain on palpation of medial to mid heel 6 of 10  ESWT administered to bilateral heels for 5 joules and tolerated well. Advised against NSAIDs and ice. He is to follow up in 1 week for 2nd  treatment

## 2017-03-11 ENCOUNTER — Ambulatory Visit (INDEPENDENT_AMBULATORY_CARE_PROVIDER_SITE_OTHER): Payer: Self-pay | Admitting: Podiatry

## 2017-03-11 DIAGNOSIS — M722 Plantar fascial fibromatosis: Secondary | ICD-10-CM

## 2017-04-08 NOTE — Progress Notes (Signed)
Pt presents with ongoing pain in both heels, lasting for several months. He states that he has tried other therapies with no relief in pain. He states that he noticed that the pain has stayed the same but he has been standing and walking more than usual due to his job. He does state that the morning pain has improved  Pain on palpation of medial to mid heel 4 of 10  ESWT administered to bilateral heels for 10.5 joules and tolerated well. Advised against NSAIDs and ice. He is to follow up in 4 weeks for 4th treatment

## 2017-04-10 ENCOUNTER — Ambulatory Visit: Payer: 59

## 2017-04-22 ENCOUNTER — Telehealth: Payer: Self-pay | Admitting: Podiatry

## 2017-04-22 NOTE — Telephone Encounter (Signed)
Left message for pt to call office to reschedule EPat that was cancelled 04/10/2017.

## 2017-05-16 ENCOUNTER — Encounter: Payer: Self-pay | Admitting: *Deleted

## 2017-05-16 ENCOUNTER — Emergency Department
Admission: EM | Admit: 2017-05-16 | Discharge: 2017-05-16 | Disposition: A | Discharge status: Home / Self Care | Payer: 59 | Attending: Emergency Medicine | Admitting: Emergency Medicine

## 2017-05-16 DIAGNOSIS — R064 Hyperventilation: Secondary | ICD-10-CM

## 2017-05-16 DIAGNOSIS — Z79899 Other long term (current) drug therapy: Secondary | ICD-10-CM | POA: Insufficient documentation

## 2017-05-16 DIAGNOSIS — R110 Nausea: Secondary | ICD-10-CM

## 2017-05-16 DIAGNOSIS — Z791 Long term (current) use of non-steroidal anti-inflammatories (NSAID): Secondary | ICD-10-CM | POA: Insufficient documentation

## 2017-05-16 DIAGNOSIS — R252 Cramp and spasm: Secondary | ICD-10-CM | POA: Diagnosis present

## 2017-05-16 MED ORDER — ONDANSETRON 4 MG PO TBDP
4.0000 mg | ORAL_TABLET | Freq: Once | ORAL | Status: AC
  Administered 2017-05-16: 4 mg via ORAL
  Filled 2017-05-16: qty 1

## 2017-05-16 MED ORDER — ONDANSETRON 4 MG PO TBDP: 4.0000 mg | ORAL_TABLET | Freq: Three times a day (TID) | ORAL | 0 refills | Status: DC | PRN

## 2017-05-16 NOTE — ED Triage Notes (Signed)
Pt to Walk in clinic and treated for possible STD. PT became nauseous and began vomiting. Pt then reports having a panic attack which he has a hx of. Pt arrived to ED with bilateral hand cramping and reports of cramping in his feet. Pt reports this has happened before when he hyperventilates but never to this extent. Pts hand are stiff and when fingers are moved by RN pt verbalized discomfort. Pulse strong and equal bilaterally. Color appropriate.

## 2017-05-16 NOTE — ED Notes (Signed)
Discussed discharge instructions, prescriptions, and follow-up care with patient. No questions or concerns at this time. Pt stable at discharge.  

## 2017-05-16 NOTE — ED Notes (Signed)
Pt states had a "massive panic attack" earlier and had bilateral hand cramping afterwards. Pt presents c/o continued nausea but states that the cramping is getting better. Pt states that he feels like for the most part he has full ROM back in his hands, states still feels like he has a little bit of cramping bilaterally but has more movement than when he arrived.

## 2017-05-16 NOTE — ED Provider Notes (Signed)
[facility] Emergency Department Provider Note  ____________________________________________  Time seen: Approximately 3:50 PM  I have reviewed the triage vital signs and the nursing notes.   HISTORY  Chief Complaint Muscle Cramping   HPI Mete Rogelio Hernandez-Villela is a 31 y.o. male who presents to the emergency department for evaluation ofbilateral hand cramping and a single episode of nausea and vomiting. He states that he has panic attacks and hyperventilates if he feels as if he is going to vomit. He states this is his "trigger." Symptoms have resolved with the exception of mild nausea.  History reviewed. No pertinent past medical history.  Patient Active Problem List   Diagnosis Date Noted  . Onychomycosis 01/26/2016  . Plantar fasciitis 12/27/2015  . Tinea pedis of both feet 12/27/2015    History reviewed. No pertinent surgical history.  Prior to Admission medications   Medication Sig Start Date End Date Taking? Authorizing Provider  meloxicam (MOBIC) 15 MG tablet Take 1 tablet (15 mg total) by mouth daily. 01/30/17   Felecia Shelling, DPM  methylPREDNISolone (MEDROL) 4 MG tablet Take 1 tablet (4 mg total) by mouth daily. Take as directed 01/30/17   Felecia Shelling, DPM  ondansetron (ZOFRAN-ODT) 4 MG disintegrating tablet Take 1 tablet (4 mg total) by mouth every 8 (eight) hours as needed for nausea or vomiting. 05/16/17   Kem Boroughs B, FNP    Allergies Patient has no known allergies.  History reviewed. No pertinent family history.  Social History Social History  Substance Use Topics  . Smoking status: Never Smoker  . Smokeless tobacco: Never Used  . Alcohol use No    Review of Systems Constitutional: Negative for fever. ENT: Negative for sore throat. Respiratory: Positive for rapid respiratory rate, negative for cough. Gastrointestinal: No abdominal pain.  No nausea, no vomiting.  No diarrhea.  Musculoskeletal: Positive for  generalized body aches. Skin: Negative for rash/lesion/wound. Neurological: Negative for headaches, focal weakness or numbness.  ____________________________________________   PHYSICAL EXAM:  VITAL SIGNS: ED Triage Vitals  Enc Vitals Group     BP 05/16/17 1410 135/86     Pulse Rate 05/16/17 1410 (!) 111     Resp 05/16/17 1410 18     Temp 05/16/17 1410 98.5 F (36.9 C)     Temp Source 05/16/17 1410 Oral     SpO2 05/16/17 1410 100 %     Weight 05/16/17 1411 145 lb (65.8 kg)     Height 05/16/17 1411 5\' 6"  (1.676 m)     Head Circumference --      Peak Flow --      Pain Score 05/16/17 1410 8     Pain Loc --      Pain Edu? --      Excl. in GC? --     Constitutional: Alert and oriented. Well appearing and in no acute distress. Eyes: Conjunctivae are normal. PERRL. EOMI. Head: Atraumatic. Nose: No congestion/rhinnorhea. Mouth/Throat: Mucous membranes are moist. Neck: No stridor.  Cardiovascular: Normal rate, regular rhythm. Good peripheral circulation. Respiratory: Normal respiratory effort.Breath sounds clear to auscultation Musculoskeletal: Full ROM throughout.  Neurologic:  Normal speech and language. No gross focal neurologic deficits are appreciated. Speech is normal. No gait instability. Skin:  Skin is warm, dry and intact. No rash noted. Psychiatric: Mood and affect are normal. Speech and behavior are normal.  ____________________________________________   LABS (all labs ordered are listed, but only abnormal results are displayed)  Labs Reviewed - No data to display  ____________________________________________  EKG  Not indicated ____________________________________________  RADIOLOGY  Not indicated ____________________________________________   PROCEDURES  None ____________________________________________   INITIAL IMPRESSION / ASSESSMENT AND PLAN / ED COURSE  31 year old male who had initially presented to the walk-in clinic for treatment of STD.  He did receive "4 pills and a shot." He states that he believes this caused nausea which always triggers a panic attack because he hates to vomit. He had one episode of vomiting which increased the hyperventilation and panic episode. He was brought to the emergency department by family. By the time he was evaluated by me, symptoms had completely resolved. He will be given a prescription for Zofran to be taken intermittently if he feels nauseated to avoid these panic and anxiety attacks. He was encouraged to follow up with the primary care provider of his choice for symptoms that change or worsen or return to the emergency department.  Pertinent labs & imaging results that were available during my care of the patient were reviewed by me and considered in my medical decision making (see chart for details).  ____________________________________________   FINAL CLINICAL IMPRESSION(S) / ED DIAGNOSES  Final diagnoses:  Acute hyperventilation  Nausea       Chinita Pester, FNP 05/16/17 2244    Jeanmarie Plant, MD 05/17/17 [id number]

## 2017-10-27 ENCOUNTER — Other Ambulatory Visit: Payer: Self-pay | Admitting: Internal Medicine

## 2017-10-27 DIAGNOSIS — R1032 Left lower quadrant pain: Secondary | ICD-10-CM

## 2017-11-02 ENCOUNTER — Other Ambulatory Visit: Payer: Self-pay | Admitting: Internal Medicine

## 2017-11-02 DIAGNOSIS — R1032 Left lower quadrant pain: Secondary | ICD-10-CM

## 2017-11-04 ENCOUNTER — Ambulatory Visit: Payer: Managed Care, Other (non HMO)

## 2017-11-05 ENCOUNTER — Ambulatory Visit
Admission: RE | Admit: 2017-11-05 | Discharge: 2017-11-05 | Disposition: A | Discharge status: Home / Self Care | Payer: Managed Care, Other (non HMO) | Source: Ambulatory Visit | Attending: Internal Medicine | Admitting: Internal Medicine

## 2017-11-05 DIAGNOSIS — R1032 Left lower quadrant pain: Secondary | ICD-10-CM | POA: Diagnosis present

## 2017-11-12 ENCOUNTER — Other Ambulatory Visit: Payer: Self-pay | Admitting: Internal Medicine

## 2017-11-12 DIAGNOSIS — R1032 Left lower quadrant pain: Secondary | ICD-10-CM

## 2017-11-24 ENCOUNTER — Encounter: Payer: Self-pay | Admitting: Urology

## 2017-11-24 ENCOUNTER — Ambulatory Visit (INDEPENDENT_AMBULATORY_CARE_PROVIDER_SITE_OTHER): Payer: Managed Care, Other (non HMO) | Admitting: Urology

## 2017-11-24 VITALS — BP 135/77 | HR 91 | Ht 66.0 in | Wt 145.0 lb

## 2017-11-24 DIAGNOSIS — R309 Painful micturition, unspecified: Secondary | ICD-10-CM | POA: Diagnosis not present

## 2017-11-24 LAB — URINALYSIS, COMPLETE
Bilirubin, UA: NEGATIVE
GLUCOSE, UA: NEGATIVE
KETONES UA: NEGATIVE
LEUKOCYTES UA: NEGATIVE
Nitrite, UA: NEGATIVE
PROTEIN UA: NEGATIVE
RBC, UA: NEGATIVE
Specific Gravity, UA: 1.02 (ref 1.005–1.030)
UUROB: 1 mg/dL (ref 0.2–1.0)
pH, UA: 7 (ref 5.0–7.5)

## 2017-11-24 LAB — BLADDER SCAN AMB NON-IMAGING: Scan Result: 50

## 2017-11-24 NOTE — Progress Notes (Signed)
11/24/2017 1:53 PM   Colin Davis 08/20/1986 [id number]  Referring provider: Gracelyn Nurse, MD [address] [place], [place] [address]  Chief Complaint  Patient presents with  . New Patient (Initial Visit)    HPI: Patient is a 32 year old Hispanic male who was referred by Dr. Letitia Libra for pain in the left side of his penis when urinating.  He states his symptoms began last August with a left suprapubic irritation that occurred only with urination and exertion.  He describes the irritation as a feeling of a bubble in the upper groin area on the left.  [facility].  He underwent STI testing and all was negative.    He had a pelvic ultrasound on 11/05/2017 and in was negative.  Contrast CT of the pelvis is pending.    He states over the last few weeks the suprapubic irritation has improved, but he is having urgency that is concentrated at the tip of his penis associated with post void dribbling.  He is sexually active and does not use condoms, but he states he is in a monogamous relationship.    He denies any known injury to the area.  He does exercise frequently with running, elliptical machines and weightlifting.    He is not having pain with ejaculation.  He is not having blood in the ejaculate fluid, urine or stool.  He has not had any pain with erections.  He is not having a penile discharge.  His baseline urinary symptoms consist of frequency, nocturia and intermittency.    Patient denies any gross hematuria, dysuria or suprapubic/flank pain.  Patient denies any fevers, chills, nausea or vomiting.      His UA today is negative.  His PVR is 50 mL.  He does not have a history of bedwetting.        PMH: No past medical history on file.  Surgical History: No past surgical history on file.  Home Medications:  Allergies as of 11/24/2017   No Known Allergies     Medication List        Accurate as of 11/24/17 11:59 PM.  Always use your most recent med list.          amoxicillin-clavulanate 875-125 MG tablet Commonly known as:  AUGMENTIN Take 1 tablet by mouth every 12 (twelve) hours.   guaiFENesin-codeine 100-10 MG/5ML syrup TAKE 5 MILLILITERS BY MOUTH EVERY 8 HOURS AS NEEDED   meloxicam 15 MG tablet Commonly known as:  MOBIC Take 1 tablet (15 mg total) by mouth daily.   methylPREDNISolone 4 MG tablet Commonly known as:  MEDROL Take 1 tablet (4 mg total) by mouth daily. Take as directed   ondansetron 4 MG disintegrating tablet Commonly known as:  ZOFRAN-ODT Take 1 tablet (4 mg total) by mouth every 8 (eight) hours as needed for nausea or vomiting.       Allergies: No Known Allergies  Family History: Family History  Problem Relation Age of Onset  . Kidney cancer Neg Hx   . Kidney disease Neg Hx   . Prostate cancer Neg Hx     Social History:  reports that  has never smoked. he has never used smokeless tobacco. He reports that he does not drink alcohol or use drugs.  ROS: UROLOGY Frequent Urination?: Yes Hard to postpone urination?: No Burning/pain with urination?: Yes Get up at night to urinate?: Yes Leakage of urine?: No Urine stream starts and stops?: Yes Trouble  starting stream?: No Do you have to strain to urinate?: No Blood in urine?: No Urinary tract infection?: No Sexually transmitted disease?: No Injury to kidneys or bladder?: No Painful intercourse?: No Weak stream?: No Erection problems?: No Penile pain?: No  Gastrointestinal Nausea?: No Vomiting?: No Indigestion/heartburn?: Yes Diarrhea?: No Constipation?: Yes  Constitutional Fever: No Night sweats?: No Weight loss?: No Fatigue?: No  Skin Skin rash/lesions?: No Itching?: No  Eyes Blurred vision?: No Double vision?: No  Ears/Nose/Throat Sore throat?: Yes Sinus problems?: No  Hematologic/Lymphatic Swollen glands?: No Easy bruising?: No  Cardiovascular Leg swelling?: No Chest pain?:  No  Respiratory Cough?: Yes Shortness of breath?: No  Endocrine Excessive thirst?: No  Musculoskeletal Back pain?: No Joint pain?: No  Neurological Headaches?: No Dizziness?: No  Psychologic Depression?: No Anxiety?: Yes  Physical Exam: BP 135/77   Pulse 91   Ht 5\' 6"  (1.676 m)   Wt 145 lb (65.8 kg)   BMI 23.40 kg/m   Constitutional: Well nourished. Alert and oriented, No acute distress. HEENT: [place] AT, moist mucus membranes. Trachea midline, no masses. Cardiovascular: No clubbing, cyanosis, or edema. Respiratory: Normal respiratory effort, no increased work of breathing. GI: Abdomen is soft, non tender, non distended, no abdominal masses. Liver and spleen not palpable.  No hernias appreciated.  Stool sample for occult testing is not indicated.   GU: No CVA tenderness.  No bladder fullness or masses.  Patient with uncircumcised phallus.   Foreskin easily retracted   Urethral meatus is patent.  No penile discharge. No penile lesions or rashes. Scrotum without lesions, cysts, rashes and/or edema.  Testicles are located scrotally bilaterally. No masses are appreciated in the testicles. Left and right epididymis are normal. Rectal: Patient with  normal sphincter tone. Anus and perineum without scarring or rashes. No rectal masses are appreciated. Prostate is approximately 35 grams, no nodules are appreciated. Seminal vesicles are normal. Skin: No rashes, bruises or suspicious lesions. Lymph: No cervical or inguinal adenopathy. Neurologic: Grossly intact, no focal deficits, moving all 4 extremities. Psychiatric: Normal mood and affect.  Laboratory Data: Lab Results  Component Value Date   WBC 5.2 02/25/2016   HGB 15.3 02/25/2016   HCT 44.0 02/25/2016   MCV 91 02/25/2016   PLT 218 02/25/2016    No results found for: CREATININE  No results found for: PSA  No results found for: TESTOSTERONE  No results found for: HGBA1C  No results found for: TSH  No results found  for: CHOL, HDL, CHOLHDL, VLDL, LDLCALC  Lab Results  Component Value Date   AST 18 02/25/2016   Lab Results  Component Value Date   ALT 23 02/25/2016   No components found for: ALKALINEPHOPHATASE No components found for: BILIRUBINTOTAL  No results found for: ESTRADIOL  Urinalysis Negative.  See Epic.  I have reviewed the labs.   Pertinent Imaging: CLINICAL DATA:  Left lower quadrant abdominal pain following erection.  EXAM: ULTRASOUND OF Left GROIN SOFT TISSUES  TECHNIQUE: Ultrasound examination of the left lower quadrant and groin soft tissues was performed in the area of clinical concern.  COMPARISON:  None.  FINDINGS: No cystic or solid masses are seen. No evidence of inguinal hernia. No evidence of inguinal lymphadenopathy.  IMPRESSION: Normal focused left lower quadrant/left groin ultrasound.   Electronically Signed   By: Ted Mcalpineobrinka  Dimitrova M.D.   On: 11/05/2017 16:09 I have independently reviewed the films.    Assessment & Plan:    1. Painful urination - Urinalysis, Complete - negative -  BLADDER SCAN AMB NON-IMAGING - low residual - CT of the pelvis on 12/04/2017 was negative  - if CT is negative will pursue cystoscopy to rule out stricture    Return for pending CT scan results .  These notes generated with voice recognition software. I apologize for typographical errors.  Michiel Cowboy, PA-C  [facility] [address] [place], [place] [address] [phone]

## 2017-12-04 ENCOUNTER — Ambulatory Visit
Admission: RE | Admit: 2017-12-04 | Discharge: 2017-12-04 | Disposition: A | Discharge status: Home / Self Care | Payer: Managed Care, Other (non HMO) | Source: Ambulatory Visit | Attending: Internal Medicine | Admitting: Internal Medicine

## 2017-12-04 ENCOUNTER — Ambulatory Visit: Payer: Managed Care, Other (non HMO)

## 2017-12-04 DIAGNOSIS — R1032 Left lower quadrant pain: Secondary | ICD-10-CM | POA: Diagnosis present

## 2017-12-04 MED ORDER — IOPAMIDOL (ISOVUE-300) INJECTION 61%
85.0000 mL | Freq: Once | INTRAVENOUS | Status: AC | PRN
  Administered 2017-12-04: 85 mL via INTRAVENOUS

## 2017-12-06 ENCOUNTER — Telehealth: Payer: Self-pay | Admitting: Urology

## 2017-12-06 NOTE — Telephone Encounter (Signed)
Patient's CT was negative.  He needs to be scheduled for a cystoscopy.

## 2017-12-10 NOTE — Telephone Encounter (Signed)
LMOM

## 2017-12-14 NOTE — Telephone Encounter (Signed)
Number not available

## 2017-12-15 ENCOUNTER — Telehealth: Payer: Self-pay

## 2017-12-15 NOTE — Telephone Encounter (Signed)
Called and lmom

## 2017-12-17 ENCOUNTER — Telehealth: Payer: Self-pay

## 2017-12-17 ENCOUNTER — Telehealth: Payer: Self-pay | Admitting: Urology

## 2017-12-17 NOTE — Telephone Encounter (Signed)
Opened in error

## 2017-12-17 NOTE — Telephone Encounter (Signed)
Spoke with patient app made  Colin DusterMichelle

## 2017-12-17 NOTE — Telephone Encounter (Signed)
Spoke w/ pt in regards to CT results. Please call and schedule cysto for pt.

## 2017-12-24 ENCOUNTER — Ambulatory Visit (INDEPENDENT_AMBULATORY_CARE_PROVIDER_SITE_OTHER): Payer: Managed Care, Other (non HMO) | Admitting: Urology

## 2017-12-24 ENCOUNTER — Encounter: Payer: Self-pay | Admitting: Urology

## 2017-12-24 VITALS — BP 117/78 | HR 96 | Resp 16 | Ht 66.0 in | Wt 139.6 lb

## 2017-12-24 DIAGNOSIS — N4889 Other specified disorders of penis: Secondary | ICD-10-CM

## 2017-12-24 MED ORDER — PHENAZOPYRIDINE HCL 200 MG PO TABS: 200.0000 mg | ORAL_TABLET | Freq: Three times a day (TID) | ORAL | 0 refills | Status: DC | PRN

## 2017-12-24 MED ORDER — TAMSULOSIN HCL 0.4 MG PO CAPS: 0.4000 mg | ORAL_CAPSULE | Freq: Every day | ORAL | 1 refills | Status: DC

## 2017-12-24 NOTE — Progress Notes (Signed)
   12/24/17  CC: No chief complaint on file.   HPI: Refer to Colin Davis McGowan's note of 11/24/2017 for a clinical summary.  His pelvic CT showed no abnormalities.  He presents today for cystoscopy to rule out urethral stricture.  Blood pressure 117/78, pulse 96, resp. rate 16, height 5\' 6"  (1.676 m), weight 139 lb 9.6 oz (63.3 kg), SpO2 98 %. NED. A&Ox3.   No respiratory distress   Abd soft, NT, ND Normal phallus with bilateral descended testicles  Cystoscopy Procedure Note  Patient identification was confirmed, informed consent was obtained, and patient was prepped using Betadine solution.  Lidocaine jelly was administered per urethral meatus.    Preoperative abx where received prior to procedure.     Pre-Procedure: - Inspection reveals a normal caliber ureteral meatus.  Procedure: The flexible cystoscope was introduced with mild resistance due to a tight urethral meatus - No urethral strictures/lesions are present. - Normal prostate with mild inflammatory changes - Tight bladder neck - Bilateral ureteral orifices identified - Bladder mucosa  reveals no ulcers, tumors, or lesions - No bladder stones - No trabeculation  Retroflexion shows no significant abnormalities   Post-Procedure: - Patient tolerated the procedure well  Assessment/ Plan: No significant abdomen allergies on cystoscopy.  The urethral meatus was tight however unlikely accounting for his symptoms.  His pain may be referred from prostatic inflammation.  We will give a trial of tamsulosin and have him follow-up to see Colin Davis in 4-6 weeks.  Rx Pyridium was also sent for post cystoscopy irritation.

## 2017-12-25 LAB — URINALYSIS, COMPLETE
Bilirubin, UA: NEGATIVE
Glucose, UA: NEGATIVE
Ketones, UA: NEGATIVE
Leukocytes, UA: NEGATIVE
Nitrite, UA: NEGATIVE
PH UA: 5.5 (ref 5.0–7.5)
PROTEIN UA: NEGATIVE
RBC UA: NEGATIVE
SPEC GRAV UA: 1.01 (ref 1.005–1.030)
UUROB: 0.2 mg/dL (ref 0.2–1.0)

## 2017-12-30 ENCOUNTER — Ambulatory Visit: Payer: Managed Care, Other (non HMO) | Admitting: Internal Medicine

## 2017-12-30 DIAGNOSIS — Z0289 Encounter for other administrative examinations: Secondary | ICD-10-CM

## 2018-01-22 ENCOUNTER — Other Ambulatory Visit: Payer: Self-pay | Admitting: Urology

## 2018-01-22 ENCOUNTER — Telehealth: Payer: Self-pay | Admitting: Urology

## 2018-01-22 MED ORDER — TAMSULOSIN HCL 0.4 MG PO CAPS: 0.4000 mg | ORAL_CAPSULE | Freq: Every day | ORAL | 1 refills | Status: AC

## 2018-01-22 NOTE — Telephone Encounter (Signed)
Pt was recently started on Flomax and will be out Sunday, pt wants to know if he is to continue to take this medication until he sees you at his follow up appt that has been rescheduled til June.  If so, he will need a new Rx sent [facility] in [place]. Please advise. Phone # [phone] Thanks.

## 2018-01-22 NOTE — Progress Notes (Signed)
I have sent a refill in for Mr. Meno for the tamsulosin.

## 2018-01-28 ENCOUNTER — Ambulatory Visit: Payer: Managed Care, Other (non HMO) | Admitting: Urology

## 2018-03-04 ENCOUNTER — Ambulatory Visit: Payer: Managed Care, Other (non HMO) | Admitting: Urology

## 2018-05-04 ENCOUNTER — Ambulatory Visit (INDEPENDENT_AMBULATORY_CARE_PROVIDER_SITE_OTHER): Payer: Managed Care, Other (non HMO) | Admitting: Podiatry

## 2018-05-04 ENCOUNTER — Encounter: Payer: Self-pay | Admitting: Podiatry

## 2018-05-04 DIAGNOSIS — M722 Plantar fascial fibromatosis: Secondary | ICD-10-CM

## 2018-05-06 NOTE — Progress Notes (Signed)
   Subjective: 32 year old male presenting today for follow up evaluation of plantar fasciitis of bilateral feet that began 2-3 months ago. He reports soreness and aching of the bilateral heels, left worse than right. Walking and standing increases the pain. He has been stretching the feet and using orthotics with some temporary relief. Patient is here for further evaluation and treatment.    No past medical history on file.   Objective: Physical Exam General: The patient is alert and oriented x3 in no acute distress.  Dermatology: Skin is warm, dry and supple bilateral lower extremities. Negative for open lesions or macerations bilateral.   Vascular: Dorsalis Pedis and Posterior Tibial pulses palpable bilateral.  Capillary fill time is immediate to all digits.  Neurological: Epicritic and protective threshold intact bilateral.   Musculoskeletal: Tenderness to palpation at the medial calcaneal tubercale and through the insertion of the plantar fascia of the bilateral feet. All other joints range of motion within normal limits bilateral. Strength 5/5 in all groups bilateral.    Assessment: #1 plantar fasciitis bilateral feet #2 pain in bilateral feet  Plan of Care:  1. Patient evaluated.    2. Appointment with Shanda BumpsJessica, RN for EPAT. 3. Powerstep insoles dispensed today.  4. Return to clinic as needed.   Felecia ShellingBrent M. Evans, DPM [facility]  Dr. Felecia ShellingBrent M. Evans, DPM    [address]                                        [place], [place] [id number]                Office [phone]  Fax [phone]

## 2018-05-11 ENCOUNTER — Ambulatory Visit: Payer: Self-pay

## 2018-05-11 DIAGNOSIS — B351 Tinea unguium: Secondary | ICD-10-CM

## 2018-05-11 DIAGNOSIS — M722 Plantar fascial fibromatosis: Secondary | ICD-10-CM

## 2018-05-18 ENCOUNTER — Ambulatory Visit: Payer: Managed Care, Other (non HMO)

## 2018-05-18 DIAGNOSIS — M722 Plantar fascial fibromatosis: Secondary | ICD-10-CM

## 2018-05-25 NOTE — Progress Notes (Signed)
Patient presents with bilateral chronic heel pain.  This is been ongoing for him for several months now, he was previously treated with ESWT therapy and that therapy was very helpful in relieving his pain.  He states that it was his left foot last time that was so painful but now it seems to be his right foot.  Pain on palpation to mid medial arch and heel right over left.  ESWT administered to bilateral heels at 6 J and tolerated well.  He is to avoid NSAIDs and ice, follow-up next week for fifth treatment.

## 2018-05-25 NOTE — Progress Notes (Signed)
Patient presents with bilateral chronic heel pain.  This is been ongoing for him for several months now, he was previously treated with ESWT therapy and that therapy was very helpful in relieving his pain.  He states that it was his left foot last time that was so painful but now it seems to be his right foot.  He states that he had some pain the day after the procedure but has since improved.  Pain on palpation to mid medial arch and heel right over left.  ESWT administered to bilateral heels at 6 J and tolerated well.  He is to avoid NSAIDs and ice, follow-up next week for 6th treatment.

## 2018-05-26 ENCOUNTER — Other Ambulatory Visit: Payer: Managed Care, Other (non HMO)

## 2018-06-02 ENCOUNTER — Ambulatory Visit: Payer: Managed Care, Other (non HMO)

## 2018-06-02 DIAGNOSIS — M722 Plantar fascial fibromatosis: Secondary | ICD-10-CM

## 2018-06-02 NOTE — Progress Notes (Signed)
Patient presents with bilateral chronic heel pain.  This is been ongoing for him for several months now, he was previously treated with ESWT therapy and that therapy was very helpful in relieving his pain.  He states that it was his left foot last time that was so painful but now it seems to be his right foot.  Patient states that his right foot is still continuing to hurt on the heel and the lateral side of the foot.  Pain on palpation to mid medial arch and heel right over left.  ESWT administered to bilateral heels at 5 J and tolerated well.  He is to avoid NSAIDs and ice, follow-up with Dr. Logan Bores in 4 weeks for reevaluation or sooner if symptoms worsen.

## 2018-07-02 ENCOUNTER — Ambulatory Visit: Payer: Managed Care, Other (non HMO) | Admitting: Podiatry

## 2018-07-13 ENCOUNTER — Ambulatory Visit: Payer: Managed Care, Other (non HMO) | Admitting: Podiatry

## 2019-03-08 IMAGING — CT CT PELVIS W/ CM
2 of 3 series · 17 of 46 positions shown, 19 images · IV contrast (iopamidol)
Comparison: None.

CLINICAL DATA: Left lower quadrant abdominal pain.

EXAM:
CT PELVIS WITH CONTRAST
TECHNIQUE: Multidetector CT imaging of the pelvis was performed using the
standard protocol following the bolus administration of intravenous
contrast.
CONTRAST:  85mL CPGUWK-WCC IOPAMIDOL (CPGUWK-WCC) INJECTION 61%

[Series 2: pelvis · axial · 0.67mm/px · z∈[-1829,-1569]mm · 14 of 60 slices shown, 16 images (1 of 2)]
[im 4/60  soft-tissue]
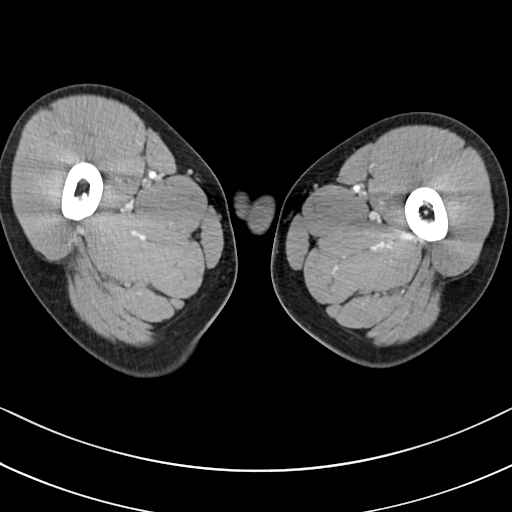
[im 4/60  bone]
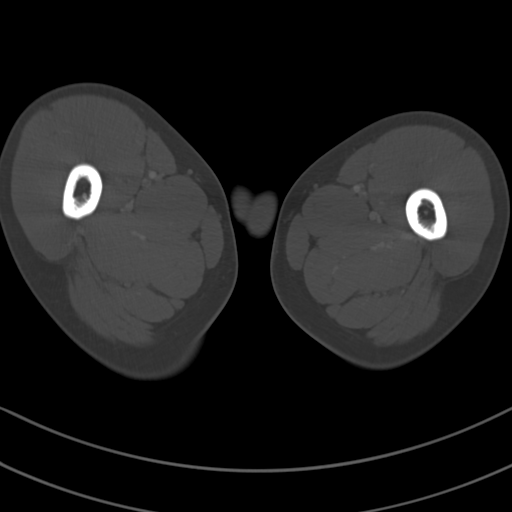
[im 8/60  soft-tissue]
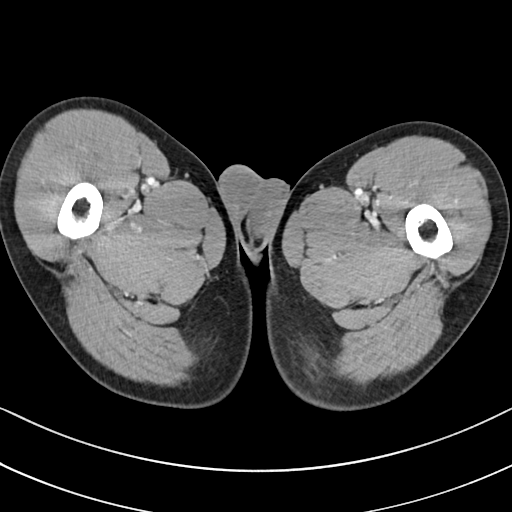
[im 12/60  soft-tissue]
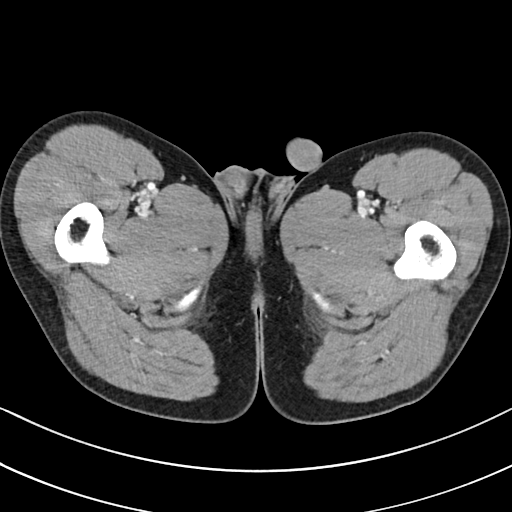
[im 16/60  soft-tissue]
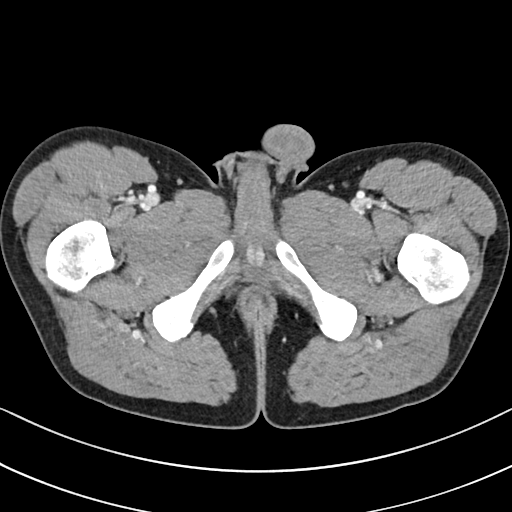
[im 20/60  soft-tissue]
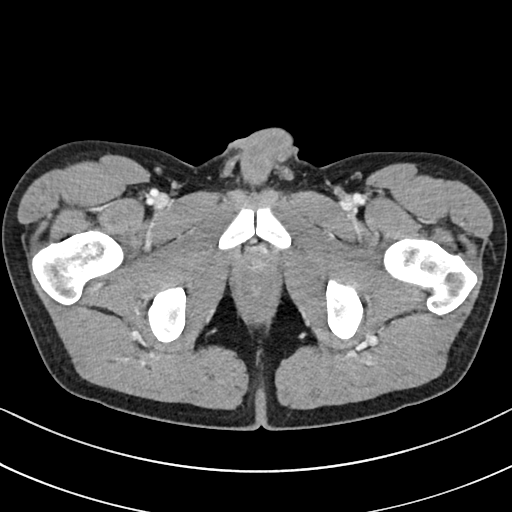
[im 23/60  soft-tissue]
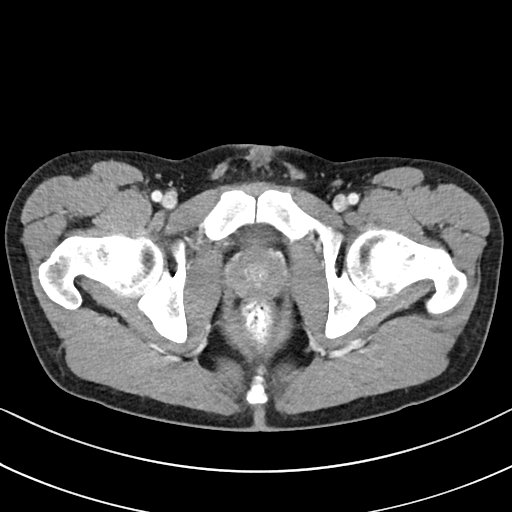
[im 27/60  soft-tissue]
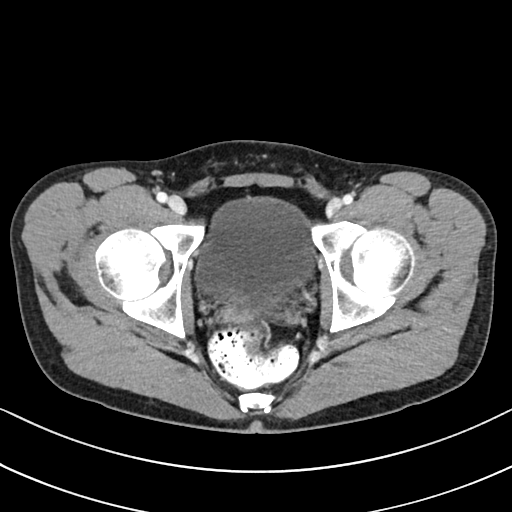
[im 33/60  soft-tissue]
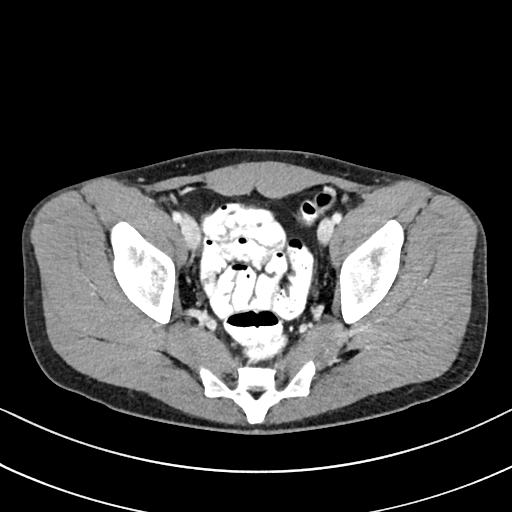
[im 37/60  soft-tissue]
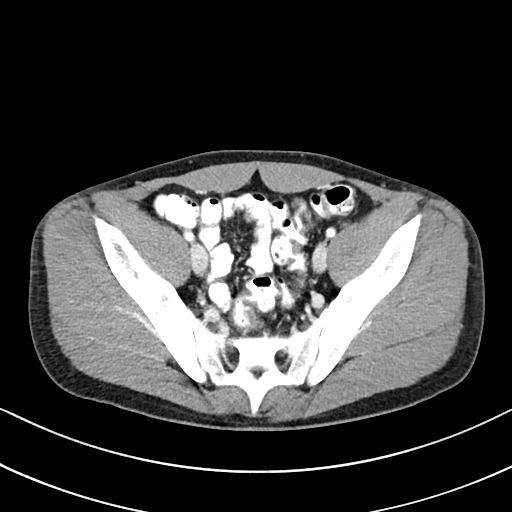
[im 37/60  bone]
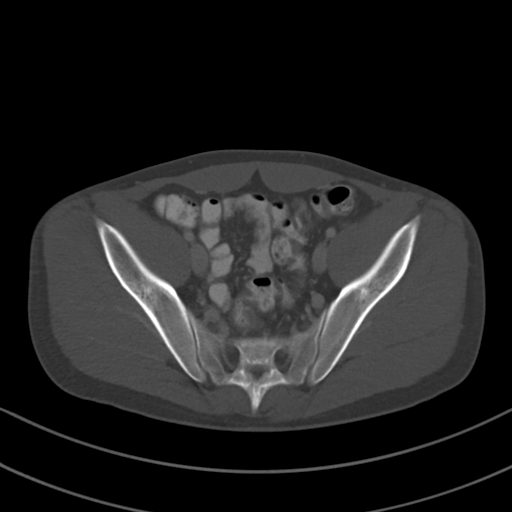
[im 40/60  soft-tissue]
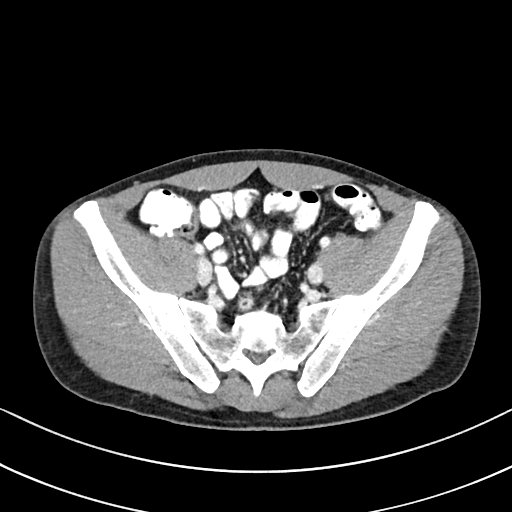
[im 44/60  soft-tissue]
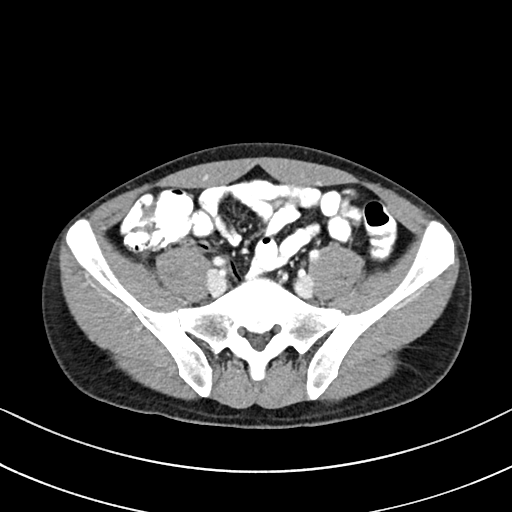
[im 48/60  soft-tissue]
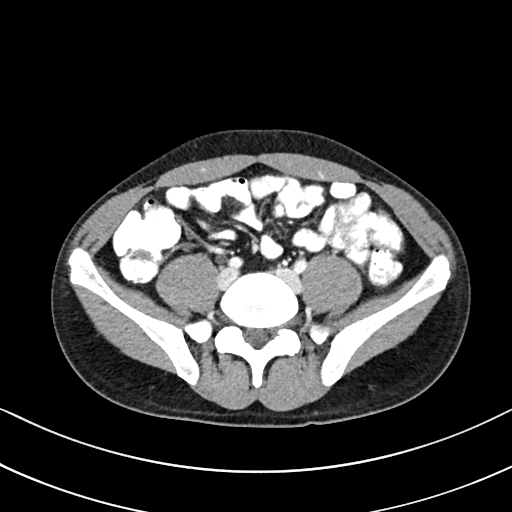
[im 52/60  soft-tissue]
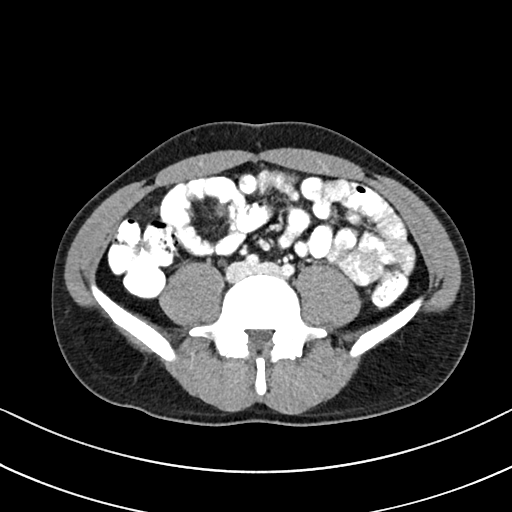
[im 56/60  soft-tissue]
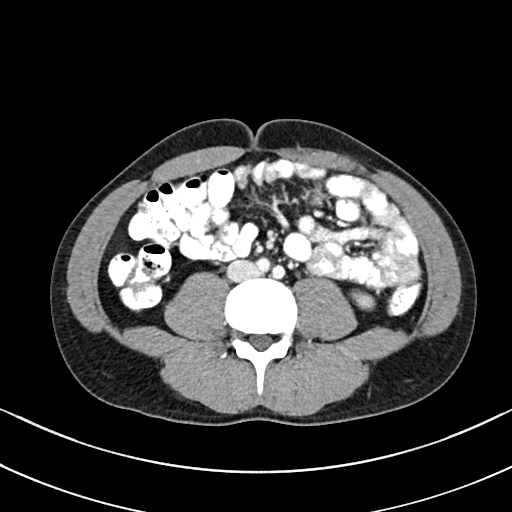

[Series 3: pelvis · coronal · 0.59mm/px · 3 of 122 slices shown (2 of 2)]
[im 41/122  soft-tissue]
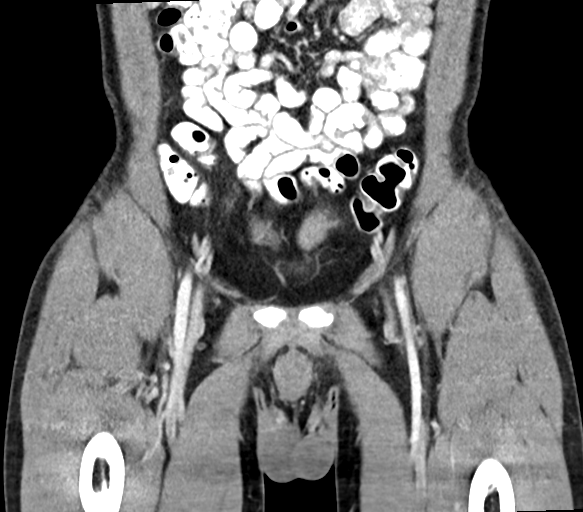
[im 54/122  soft-tissue]
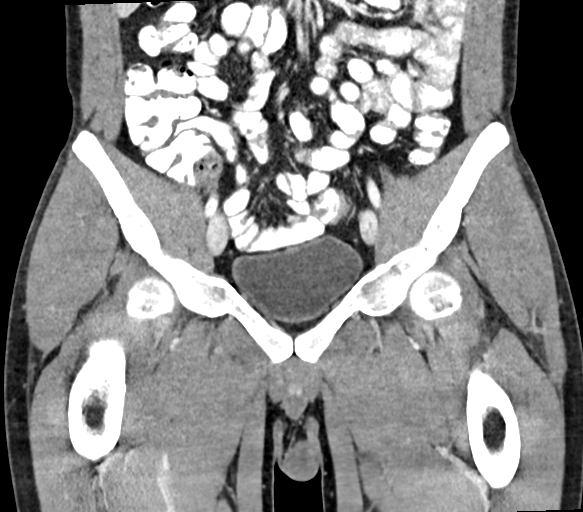
[im 68/122  soft-tissue]
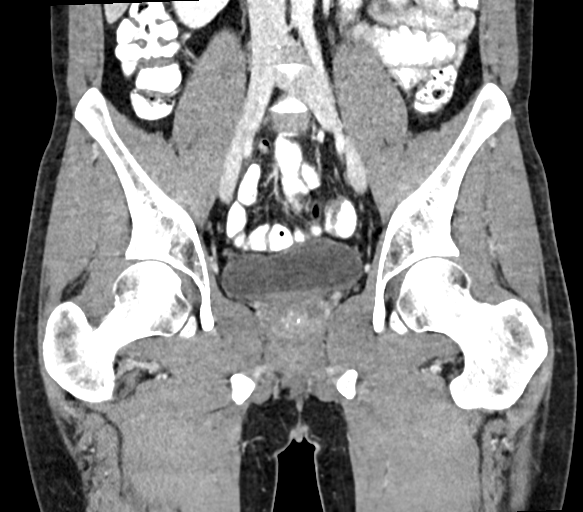

[17 of 46 positions shown; findings below may reference images not displayed]

FINDINGS: Urinary Tract:  No abnormality visualized.

Bowel:  Unremarkable visualized pelvic bowel loops.

Vascular/Lymphatic: No pathologically enlarged lymph nodes. No
significant vascular abnormality seen.

Reproductive:  No mass or other significant abnormality

Other:  None.

Musculoskeletal: No suspicious bone lesions identified.
IMPRESSION: No definite abnormality seen in the pelvis.

## 2019-12-09 ENCOUNTER — Ambulatory Visit: Payer: Managed Care, Other (non HMO)

## 2019-12-10 ENCOUNTER — Ambulatory Visit: Payer: Managed Care, Other (non HMO)

## 2019-12-10 ENCOUNTER — Ambulatory Visit: Payer: Managed Care, Other (non HMO) | Attending: Internal Medicine

## 2019-12-10 DIAGNOSIS — Z23 Encounter for immunization: Secondary | ICD-10-CM

## 2019-12-10 NOTE — Progress Notes (Signed)
   Covid-19 Vaccination Clinic  Name:  Raekwan Spelman    MRN: [id number] DOB: Aug 24, 1986  12/10/2019  Mr. Hernandez-Villela was observed post Covid-19 immunization for 15 minutes without incident. He was provided with Vaccine Information Sheet and instruction to access the V-Safe system.   Mr. Celli was instructed to call 911 with any severe reactions post vaccine: Marland Kitchen Difficulty breathing  . Swelling of face and throat  . A fast heartbeat  . A bad rash all over body  . Dizziness and weakness   Immunizations Administered    Name Date Dose VIS Date Route   Pfizer COVID-19 Vaccine 12/10/2019 11:16 AM 0.3 mL 09/02/2019 Intramuscular   Manufacturer: [employer], [employer]   Lot: [id number]   NDC: [address]-0

## 2019-12-31 ENCOUNTER — Ambulatory Visit: Payer: Managed Care, Other (non HMO) | Attending: Internal Medicine

## 2019-12-31 DIAGNOSIS — Z23 Encounter for immunization: Secondary | ICD-10-CM

## 2019-12-31 NOTE — Progress Notes (Signed)
   Covid-19 Vaccination Clinic  Name:  Slevin Gunby    MRN: [id number] DOB: Jun 02, 1986  12/31/2019  Mr. Hernandez-Villela was observed post Covid-19 immunization for 15 minutes without incident. He was provided with Vaccine Information Sheet and instruction to access the V-Safe system.   Mr. Speros was instructed to call 911 with any severe reactions post vaccine: Marland Kitchen Difficulty breathing  . Swelling of face and throat  . A fast heartbeat  . A bad rash all over body  . Dizziness and weakness   Immunizations Administered    Name Date Dose VIS Date Route   Pfizer COVID-19 Vaccine 12/31/2019 11:13 AM 0.3 mL 09/02/2019 Intramuscular   Manufacturer: [employer], [employer]   Lot: [id number]   NDC: [address]-2

## 2020-01-09 DIAGNOSIS — B351 Tinea unguium: Secondary | ICD-10-CM

## 2021-12-16 ENCOUNTER — Ambulatory Visit (INDEPENDENT_AMBULATORY_CARE_PROVIDER_SITE_OTHER): Payer: Managed Care, Other (non HMO) | Admitting: Podiatry

## 2021-12-16 ENCOUNTER — Other Ambulatory Visit: Payer: Self-pay

## 2021-12-16 DIAGNOSIS — M722 Plantar fascial fibromatosis: Secondary | ICD-10-CM

## 2021-12-16 MED ORDER — METHYLPREDNISOLONE 4 MG PO TBPK: ORAL_TABLET | ORAL | 0 refills | Status: AC

## 2021-12-16 NOTE — Progress Notes (Signed)
? ?  Subjective: ?36 y.o. male presenting as a reestablish patient for evaluation of bilateral heel pain this been going on for few weeks now.  Patient has had steroid injections and EPAT in the past for the plantar fasciitis.  He had the best results with the EPAT therapy.  He would like to discuss different treatment options today ? ?No past medical history on file. ?No past surgical history on file. ?No Known Allergies ? ? ?Objective: ?Physical Exam ?General: The patient is alert and oriented x3 in no acute distress. ? ?Dermatology: Skin is warm, dry and supple bilateral lower extremities. Negative for open lesions or macerations bilateral.  ? ?Vascular: Dorsalis Pedis and Posterior Tibial pulses palpable bilateral.  Capillary fill time is immediate to all digits. ? ?Neurological: Epicritic and protective threshold intact bilateral.  ? ?Musculoskeletal: Tenderness to palpation to the plantar aspect of the bilateral heels along the plantar fascia. All other joints range of motion within normal limits bilateral. Strength 5/5 in all groups bilateral.  ? ?Assessment: ?1. plantar fasciitis bilateral feet ? ?Plan of Care:  ?1. Patient evaluated.  Today we discussed different treatment options for the plantar fasciitis.  The patient has had both steroid injections as well as EPAT therapy.  He preferred to do the EPAT therapy because he had better results with this treatment modality.  Unfortunately our EPAT machine is waiting for parts and it is currently not operable.  Patient will be placed on a list to contact when the EPAT is up and running ?2.  In the meantime prescription for Medrol Dosepak ?3.  Continue wearing good supportive shoes with OTC arch supports ?4.  Return to clinic as needed ? ?Felecia Shelling, DPM ?[facility] ? ?Dr. Felecia Shelling, DPM  ?  ?[address]                                   ?[place], [place] [address]                ?Office [phone]  ?Fax [phone] ? ? ? ? ?

## 2022-01-08 ENCOUNTER — Ambulatory Visit (INDEPENDENT_AMBULATORY_CARE_PROVIDER_SITE_OTHER): Payer: Self-pay

## 2022-01-08 DIAGNOSIS — M722 Plantar fascial fibromatosis: Secondary | ICD-10-CM

## 2022-01-08 NOTE — Patient Instructions (Signed)

## 2022-01-08 NOTE — Progress Notes (Signed)
Patient presents for the 1st EPAT treatment today with complaint of bilateral heel pain . Diagnosed with plantar fasciitis by Dr. Logan Bores. This has been ongoing for several months. The patient has tried ice, stretching, NSAIDS and supportive shoe gear with no long term relief.  ? ?Most of the pain is located posterior heel . ? ?ESWT administered and tolerated well.Treatment settings initiated at: ? ? Energy: 15 ? ?Ended treatment session today with 3000 shocks at the following settings: ? ? Energy: 15 ? Frequency: 6.0 ? Joules: 14.72 ? ? ?Reviewed post EPAT instructions. Advised to avoid ice and NSAIDs throughout the treatment process and to utilize boot or supportive shoes for at least the next 3 days. ? ?Follow up for 2nd treatment in 1 week.  ?

## 2022-01-14 ENCOUNTER — Ambulatory Visit (INDEPENDENT_AMBULATORY_CARE_PROVIDER_SITE_OTHER): Payer: Self-pay

## 2022-01-14 DIAGNOSIS — B351 Tinea unguium: Secondary | ICD-10-CM

## 2022-01-14 DIAGNOSIS — M722 Plantar fascial fibromatosis: Secondary | ICD-10-CM

## 2022-01-14 NOTE — Progress Notes (Signed)
Patient presents for the 2nd EPAT treatment today with complaint of bilateral heel pain . Diagnosed with plantar fasciitis by Dr. Amalia Hailey. This has been ongoing for several months. The patient has tried ice, stretching, NSAIDS and supportive shoe gear with no long term relief.  ? ?Most of the pain is located bilateral posterior heel . ? ?ESWT administered and tolerated well.Treatment settings initiated at: ? ? Energy: 20 ? ?Ended treatment session today with 3000 shocks at the following settings: ? ? Energy: 20 ? Frequency: 5.0 ? Joules: 19.62 ? ? ?Reviewed post EPAT instructions. Advised to avoid ice and NSAIDs throughout the treatment process and to utilize boot or supportive shoes for at least the next 3 days. ? ?Follow up for 3rd treatment in 1 week.  ?

## 2022-01-20 ENCOUNTER — Ambulatory Visit (INDEPENDENT_AMBULATORY_CARE_PROVIDER_SITE_OTHER): Payer: Managed Care, Other (non HMO)

## 2022-01-20 DIAGNOSIS — M722 Plantar fascial fibromatosis: Secondary | ICD-10-CM

## 2022-01-20 DIAGNOSIS — B351 Tinea unguium: Secondary | ICD-10-CM

## 2022-01-20 NOTE — Progress Notes (Signed)
Patient presents for the 3rd EPAT treatment today with complaint of bilateral heel pain . Diagnosed with plantar fasciitis by Dr. Logan Bores. This has been ongoing for several months. The patient has tried ice, stretching, NSAIDS and supportive shoe gear with no long term relief.  ? ?Most of the pain is located bilateral posterior heel . ? ?ESWT administered and tolerated well.Treatment settings initiated at: ? ? Energy: 20 ? ?Ended treatment session today with 3000 shocks at the following settings: ? ? Energy: 20 ? Frequency: 5.0 ? Joules: 19.62 ? ? ?Reviewed post EPAT instructions. Advised to avoid ice and NSAIDs throughout the treatment process and to utilize boot or supportive shoes for at least the next 3 days. ? ?Follow up for 4th treatment in 2 week.  ?

## 2022-02-04 ENCOUNTER — Ambulatory Visit (INDEPENDENT_AMBULATORY_CARE_PROVIDER_SITE_OTHER): Payer: Self-pay

## 2022-02-04 DIAGNOSIS — B351 Tinea unguium: Secondary | ICD-10-CM

## 2022-02-04 DIAGNOSIS — M722 Plantar fascial fibromatosis: Secondary | ICD-10-CM

## 2022-02-04 NOTE — Progress Notes (Signed)
Patient presents for the 3rd EPAT treatment today with complaint of bilateral heel pain . Diagnosed with plantar fasciitis by Dr. Amalia Hailey. This has been ongoing for several months. The patient has tried ice, stretching, NSAIDS and supportive shoe gear with no long term relief.  ? ?Most of the pain is located bilateral posterior heel . ? ?ESWT administered and tolerated well.Treatment settings initiated at: ? ? Energy: 30 ? ?Ended treatment session today with 3000 shocks at the following settings: ? ? Energy: 30 ? Frequency: 4.0 ? Joules: 29.43 ? ? ?Reviewed post EPAT instructions. Advised to avoid ice and NSAIDs throughout the treatment process and to utilize boot or supportive shoes for at least the next 3 days. ? ?Follow up with Dr. Amalia Hailey in 4 weeks. ?

## 2022-02-28 ENCOUNTER — Encounter: Payer: Self-pay | Admitting: Podiatry

## 2022-04-01 ENCOUNTER — Ambulatory Visit: Payer: Managed Care, Other (non HMO) | Admitting: Podiatry

## 2022-04-08 ENCOUNTER — Ambulatory Visit (INDEPENDENT_AMBULATORY_CARE_PROVIDER_SITE_OTHER): Payer: Managed Care, Other (non HMO) | Admitting: Podiatry

## 2022-04-08 DIAGNOSIS — M722 Plantar fascial fibromatosis: Secondary | ICD-10-CM

## 2022-04-08 NOTE — Progress Notes (Signed)
   Chief Complaint  Patient presents with   Follow-up    6 week follow-up after epat.    Subjective: 36 y.o. male presenting today for follow-up evaluation of plantar fasciitis to the bilateral feet.  Patient states that again the EPAT therapy did well.  He is concerned because he continues to have to pursue EPAT therapy which is expensive.  He presents for further treatment and evaluation and to discuss long-term treatment options.  No past medical history on file. No past surgical history on file. No Known Allergies   Objective: Physical Exam General: The patient is alert and oriented x3 in no acute distress.  Dermatology: Skin is warm, dry and supple bilateral lower extremities. Negative for open lesions or macerations bilateral.   Vascular: Dorsalis Pedis and Posterior Tibial pulses palpable bilateral.  Capillary fill time is immediate to all digits.  Neurological: Epicritic and protective threshold intact bilateral.   Musculoskeletal: Minimal tenderness to palpation to the plantar aspect of the bilateral heels along the plantar fascia. All other joints range of motion within normal limits bilateral. Strength 5/5 in all groups bilateral.   Assessment: 1. plantar fasciitis bilateral feet. LT > RT  Plan of Care:  1. Patient evaluated.   2.  The patient has had good success with the EPAT.  He says that currently the pain is minimal. 3.  Continue wearing OTC arch supports and good sneakers 4.  Today we did discuss long-term treatment options also including surgery.  If the patient has an acute flareup of pain and plantar fasciitis again we will order an MRI and likely of the left heel since this foot appears to be most symptomatic. 5.  Return to clinic as needed  Felecia Shelling, DPM [facility]  Dr. Felecia Shelling, DPM    [address] [place], [place] [address]                Office [phone]  Fax [phone]

## 2023-07-27 ENCOUNTER — Other Ambulatory Visit: Payer: Self-pay | Admitting: Internal Medicine

## 2023-07-27 DIAGNOSIS — R1032 Left lower quadrant pain: Secondary | ICD-10-CM

## 2023-07-30 ENCOUNTER — Ambulatory Visit
Admission: RE | Admit: 2023-07-30 | Discharge: 2023-07-30 | Disposition: A | Discharge status: Home / Self Care | Payer: Managed Care, Other (non HMO) | Source: Ambulatory Visit | Attending: Internal Medicine | Admitting: Internal Medicine

## 2023-07-30 DIAGNOSIS — R1032 Left lower quadrant pain: Secondary | ICD-10-CM

## 2023-07-30 MED ORDER — IOPAMIDOL (ISOVUE-300) INJECTION 61%
100.0000 mL | Freq: Once | INTRAVENOUS | Status: AC | PRN
  Administered 2023-07-30: 100 mL via INTRAVENOUS
